# Patient Record
Sex: Male | Born: 1945
Health system: Southern US, Community
[De-identification: ages and names within clinical notes are randomized; demographics above are authoritative.]

## PROBLEM LIST (undated history)

## (undated) DIAGNOSIS — Z973 Presence of spectacles and contact lenses: Secondary | ICD-10-CM

## (undated) DIAGNOSIS — F419 Anxiety disorder, unspecified: Secondary | ICD-10-CM

## (undated) DIAGNOSIS — Z8719 Personal history of other diseases of the digestive system: Secondary | ICD-10-CM

## (undated) DIAGNOSIS — C801 Malignant (primary) neoplasm, unspecified: Secondary | ICD-10-CM

## (undated) DIAGNOSIS — F32A Depression, unspecified: Secondary | ICD-10-CM

## (undated) DIAGNOSIS — K219 Gastro-esophageal reflux disease without esophagitis: Secondary | ICD-10-CM

## (undated) DIAGNOSIS — I10 Essential (primary) hypertension: Secondary | ICD-10-CM

---

## 1999-04-06 ENCOUNTER — Ambulatory Visit (HOSPITAL_COMMUNITY): Admission: RE | Admit: 1999-04-06 | Discharge: 1999-04-06 | Payer: Self-pay | Admitting: Internal Medicine

## 1999-04-06 ENCOUNTER — Encounter: Payer: Self-pay | Admitting: Internal Medicine

## 2001-07-23 ENCOUNTER — Encounter (INDEPENDENT_AMBULATORY_CARE_PROVIDER_SITE_OTHER): Payer: Self-pay

## 2001-07-23 ENCOUNTER — Other Ambulatory Visit: Admission: RE | Admit: 2001-07-23 | Discharge: 2001-07-23 | Payer: Self-pay | Admitting: Gastroenterology

## 2001-10-29 ENCOUNTER — Inpatient Hospital Stay (HOSPITAL_COMMUNITY): Admission: EM | Admit: 2001-10-29 | Discharge: 2001-10-30 | Payer: Self-pay | Admitting: Physical Therapy

## 2004-10-25 ENCOUNTER — Ambulatory Visit: Payer: Self-pay | Admitting: Gastroenterology

## 2004-11-05 ENCOUNTER — Ambulatory Visit: Payer: Self-pay | Admitting: Gastroenterology

## 2006-06-07 ENCOUNTER — Emergency Department (HOSPITAL_COMMUNITY): Admission: EM | Admit: 2006-06-07 | Discharge: 2006-06-07 | Payer: Self-pay | Admitting: Emergency Medicine

## 2006-06-19 ENCOUNTER — Inpatient Hospital Stay (HOSPITAL_COMMUNITY): Admission: RE | Admit: 2006-06-19 | Discharge: 2006-06-20 | Payer: Self-pay | Admitting: Neurosurgery

## 2006-07-27 ENCOUNTER — Encounter: Admission: RE | Admit: 2006-07-27 | Discharge: 2006-07-27 | Payer: Self-pay | Admitting: Neurosurgery

## 2007-02-02 ENCOUNTER — Inpatient Hospital Stay (HOSPITAL_COMMUNITY): Admission: RE | Admit: 2007-02-02 | Discharge: 2007-02-04 | Payer: Self-pay | Admitting: Neurosurgery

## 2008-05-21 ENCOUNTER — Encounter: Admission: RE | Admit: 2008-05-21 | Discharge: 2008-05-21 | Payer: Self-pay | Admitting: Neurosurgery

## 2008-06-01 ENCOUNTER — Encounter: Admission: RE | Admit: 2008-06-01 | Discharge: 2008-06-01 | Payer: Self-pay | Admitting: Neurosurgery

## 2009-10-26 ENCOUNTER — Encounter (INDEPENDENT_AMBULATORY_CARE_PROVIDER_SITE_OTHER): Payer: Self-pay

## 2009-10-27 ENCOUNTER — Ambulatory Visit: Payer: Self-pay | Admitting: Gastroenterology

## 2009-11-10 ENCOUNTER — Ambulatory Visit: Payer: Self-pay | Admitting: Gastroenterology

## 2009-11-10 DIAGNOSIS — K2990 Gastroduodenitis, unspecified, without bleeding: Secondary | ICD-10-CM

## 2009-11-10 DIAGNOSIS — K2970 Gastritis, unspecified, without bleeding: Secondary | ICD-10-CM | POA: Insufficient documentation

## 2009-11-13 LAB — CONVERTED CEMR LAB: UREASE: NEGATIVE

## 2009-11-15 ENCOUNTER — Encounter: Payer: Self-pay | Admitting: Gastroenterology

## 2010-01-24 ENCOUNTER — Ambulatory Visit: Payer: Self-pay | Admitting: Vascular Surgery

## 2010-10-23 NOTE — Miscellaneous (Signed)
Summary: [facility] PV prep  Clinical Lists Changes  Medications: Added new medication of MOVIPREP 100 GM  SOLR (PEG-KCL-NACL-NASULF-NA ASC-C) as directed - Signed Rx of MOVIPREP 100 GM  SOLR (PEG-KCL-NACL-NASULF-NA ASC-C) as directed;  #1 x 0;  Signed;  Entered by: Doristine Church RN II;  Authorized by: Mardella Layman MD [facility];  Method used: [facility]  [address] [phone]*, [address], [place], [place]  [address], Ph: [phone] or [phone], Fax: [phone] Allergies: Added new allergy or adverse reaction of * THIMARISOL Observations: Added new observation of ALLERGY REV: Done (10/27/2009 12:45) Added new observation of NKA: F (10/27/2009 12:45)    Prescriptions: MOVIPREP 100 GM  SOLR (PEG-KCL-NACL-NASULF-NA ASC-C) as directed  #1 x 0   Entered by:   Doristine Church RN II   Authorized by:   Mardella Layman MD [facility]   Signed by:   Doristine Church RN II on 10/27/2009   Method used:   [facility]        [facility]  [address] [phone]* (retail)       [address]       [place], [place]  [address]       Ph: [phone] or [phone]       Fax: [phone]   RxID:   [id number]

## 2010-10-23 NOTE — Letter (Signed)
Summary: Patient Notice- Polyp Results  [facility]  [address] [place], [place] [address]   Phone: [phone]  Fax: [phone]        November 15, 2009 MRN: [id number]    [name] [address] [place], [place]  [address]    Dear Mr. Fojtik,  I am pleased to inform you that the colon polyp(s) removed during your recent colonoscopy was (were) found to be benign (no cancer detected) upon pathologic examination.  I recommend you have a repeat colonoscopy examination in 5_ years to look for recurrent polyps, as having colon polyps increases your risk for having recurrent polyps or even colon cancer in the future.  Should you develop new or worsening symptoms of abdominal pain, bowel habit changes or bleeding from the rectum or bowels, please schedule an evaluation with either your primary care physician or with me.  Additional information/recommendations:  __ No further action with gastroenterology is needed at this time. Please      follow-up with your primary care physician for your other healthcare      needs.  __ Please call [phone] to schedule a return visit to review your      situation.  __ Please keep your follow-up visit as already scheduled.  XX__ Continue treatment plan as outlined the day of your exam.  Please call us if you are having persistent problems or have questions about your condition that have not been fully answered at this time.  Sincerely,  Mardella Layman MD [facility]  This letter has been electronically signed by your physician.  Appended Document: Patient Notice- Polyp Results  Letter mailed 2.24.11

## 2010-10-23 NOTE — Procedures (Signed)
Summary: Upper Endoscopy  Patient: [name] Note: All result statuses are Final unless otherwise noted.  Tests: (1) Upper Endoscopy (EGD)   EGD Upper Endoscopy       DONE     [facility]     [address]     [place], [place]  [address]           ENDOSCOPY PROCEDURE REPORT           PATIENT:  [name], [name]  MR#:  [id number]     BIRTHDATE:  1946/03/13, 63 yrs. old  GENDER:  male           ENDOSCOPIST:  Vania Rea. Jarold Motto, MD, [facility]     Referred by:           PROCEDURE DATE:  11/10/2009     PROCEDURE:  EGD with biopsy     ASA CLASS:  Class II     INDICATIONS:  Screening for Barrett's esopahgus in a GERD patient.                 MEDICATIONS:   There was residual sedation effect present from     prior procedure., Fentanyl 25 mcg IV, Versed 3 mg IV     TOPICAL ANESTHETIC:           DESCRIPTION OF PROCEDURE:   After the risks benefits and     alternatives of the procedure were thoroughly explained, informed     consent was obtained.  The LB GIF-H180 [id number] endoscope was     introduced through the mouth and advanced to the second portion of     the duodenum, limited by retching and gagging.   The instrument     was slowly withdrawn as the mucosa was fully examined.     <<PROCEDUREIMAGES>>           Barrett's esophagus was found in the distal esophagus. BIOPSIES     DONE.  Mild gastritis was found in the antrum. CLO BX. DONE.     There were multiple polyps identified. in the fundus. LARGE     INFLAMMED POLYPS BIOPSIED.IN FUNDUS,CARDIA,AND BODY OF STOMACH     Retroflexed views revealed not done.    The scope was then withdrawn     from the patient and the procedure completed.           COMPLICATIONS:  None           ENDOSCOPIC IMPRESSION:     1) Barrett's esophagus in the distal esophagus     2) Mild gastritis in the antrum     3) Polyps, multiple in the fundus     4) Not done     1.CHRONIC GERD.R/O BARRETT'S AND DYSPLASIA     2.MULTIPLE INFLAMMED GASTRIC  POLYPS.R/O ADENOMAS VS HYPERPLASTIC     POLYPS FROM PPI USE.     RECOMMENDATIONS:     1) await biopsy results     2) continue current medications           REPEAT EXAM:  No           ______________________________     Vania Rea. Jarold Motto, MD, [facility]           CC:  Pecola Lawless, MD           n.     Rosalie DoctorMarland Kitchen   Vania Rea. Kasim Mccorkle at 11/10/2009 02:49 PM           Katrina Stack, [id number]  Note: An exclamation mark (!) indicates a result that was not dispersed into the flowsheet. Document Creation Date: 11/10/2009 2:49 PM _______________________________________________________________________  (1) Order result status: Final Collection or observation date-time: 11/10/2009 14:39 Requested date-time:  Receipt date-time:  Reported date-time:  Referring Physician:   Ordering Physician: Sheryn Bison [id number]) Specimen Source:  Source: Launa Grill Order Number: [id number] Lab site:   Appended Document: Upper Endoscopy     Procedures Next Due Date:    EGD: 11/2011

## 2010-10-23 NOTE — Miscellaneous (Signed)
Summary: clotest  Clinical Lists Changes  Problems: Added new problem of GASTRITIS (ICD-535.50) Orders: Added new Test order of TLB-H Pylori Screen Gastric Biopsy (83013-CLOTEST) - Signed 

## 2010-10-23 NOTE — Procedures (Signed)
Summary: Colonoscopy  Patient: [name] Note: All result statuses are Final unless otherwise noted.  Tests: (1) Colonoscopy (COL)   COL Colonoscopy           DONE     [facility]     [address]     [place], [place]  [address]           COLONOSCOPY PROCEDURE REPORT           PATIENT:  [name], [name]  MR#:  [id number]     BIRTHDATE:  [birth year], 63 yrs. old  GENDER:  male           ENDOSCOPIST:  Vania Rea. Jarold Motto, MD, [facility]     Referred by:           PROCEDURE DATE:  11/10/2009     PROCEDURE:  Colonoscopy with biopsy     ASA CLASS:  Class II     INDICATIONS:  history of polyps           MEDICATIONS:   Fentanyl 50 mcg IV, Versed 7 mg IV           DESCRIPTION OF PROCEDURE:   After the risks benefits and     alternatives of the procedure were thoroughly explained, informed     consent was obtained.  Digital rectal exam was performed and     revealed no abnormalities.   The LB CF-H180AL [id number] endoscope     was introduced through the anus and advanced to the cecum, which     was identified by both the appendix and ileocecal valve, without     limitations.  The quality of the prep was adequate, using     MoviPrep.  The instrument was then slowly withdrawn as the colon     was fully examined.     <<PROCEDUREIMAGES>>           FINDINGS:  Moderate diverticulosis was found sigmoid to descending     A diminutive polyp was found in the rectum. COLD BX. EXCISED.     Retroflexed views in the rectum revealed no abnormalities.    The     scope was then withdrawn from the patient and the procedure     completed.           COMPLICATIONS:  None           ENDOSCOPIC IMPRESSION:     1) Moderate diverticulosis in the sigmoid to descending     2) Diminutive polyp in the rectum     R/O ADENOMA.     RECOMMENDATIONS:     1) High fiber diet.     2) Follow up colonoscopy in 5 years           REPEAT EXAM:  No           ______________________________     Vania Rea. Jarold Motto, MD,  [name]           CC:  Pecola Lawless, MD           n.     Rosalie DoctorMarland Kitchen   Vania Rea. Patterson at 11/10/2009 02:31 PM           Katrina Stack, [id number]  Note: An exclamation mark (!) indicates a result that was not dispersed into the flowsheet. Document Creation Date: 11/10/2009 2:31 PM _______________________________________________________________________  (1) Order result status: Final Collection or observation date-time: 11/10/2009 14:23 Requested date-time:  Receipt date-time:  Reported date-time:  Referring Physician:  Ordering Physician: Sheryn Bison [phone]) Specimen Source:  Source: Launa Grill Order Number: [id number] Lab site:   Appended Document: Orders Update    Clinical Lists Changes  Orders: Added new Test order of TLB-H Pylori Screen Gastric Biopsy (83013-CLOTEST) - Signed      Appended Document: Colonoscopy     Procedures Next Due Date:    Colonoscopy: 11/2014

## 2010-10-23 NOTE — Letter (Signed)
Summary: [facility] Instructions  [facility]  [address] [place], [place] [address]   Phone: [phone]  Fax: [phone]       [name]    [birth year]    MRN: [id number]        Procedure Day /Date:  Friday 11/10/2009     Arrival Time: 1:00 pm      Procedure Time: 2:00 pm     Location of Procedure:                    _x _  [facility] (4th Floor)   PREPARATION FOR COLONOSCOPY WITH MOVIPREP   Starting 5 days prior to your procedure Sunday 2/13 do not eat nuts, seeds, popcorn, corn, beans, peas,  salads, or any raw vegetables.  Do not take any fiber supplements (e.g. Metamucil, Citrucel, and Benefiber).  THE DAY BEFORE YOUR PROCEDURE         DATE: Thursday 2/17  1.  Drink clear liquids the entire day-NO SOLID FOOD  2.  Do not drink anything colored red or purple.  Avoid juices with pulp.  No orange juice.  3.  Drink at least 64 oz. (8 glasses) of fluid/clear liquids during the day to prevent dehydration and help the prep work efficiently.  CLEAR LIQUIDS INCLUDE: Water Jello Ice Popsicles Tea (sugar ok, no milk/cream) Powdered fruit flavored drinks Coffee (sugar ok, no milk/cream) Gatorade Juice: apple, white grape, white cranberry  Lemonade Clear bullion, consomm, broth Carbonated beverages (any kind) Strained chicken noodle soup Hard Candy                             4.  In the morning, mix first dose of MoviPrep solution:    Empty 1 Pouch A and 1 Pouch B into the disposable container    Add lukewarm drinking water to the top line of the container. Mix to dissolve    Refrigerate (mixed solution should be used within 24 hrs)  5.  Begin drinking the prep at 5:00 p.m. The MoviPrep container is divided by 4 marks.   Every 15 minutes drink the solution down to the next mark (approximately 8 oz) until the full liter is complete.   6.  Follow completed prep with 16 oz of clear liquid of your choice (Nothing red or purple).  Continue  to drink clear liquids until bedtime.  7.  Before going to bed, mix second dose of MoviPrep solution:    Empty 1 Pouch A and 1 Pouch B into the disposable container    Add lukewarm drinking water to the top line of the container. Mix to dissolve    Refrigerate  THE DAY OF YOUR PROCEDURE      DATE: Friday 2/18  Beginning at 9:00 am (5 hours before procedure):         1. Every 15 minutes, drink the solution down to the next mark (approx 8 oz) until the full liter is complete.  2. Follow completed prep with 16 oz. of clear liquid of your choice.    3. You may drink clear liquids until 12:00 pm  (2 HOURS BEFORE PROCEDURE).   MEDICATION INSTRUCTIONS  Unless otherwise instructed, you should take regular prescription medications with a small sip of water   as early as possible the morning of your procedure.    Additional medication instructions:  Do no t take HCTZ the morning of procedure  OTHER INSTRUCTIONS  You will need a responsible adult at least 64 years of age to accompany you and drive you home.   This person must remain in the waiting room during your procedure.  Wear loose fitting clothing that is easily removed.  Leave jewelry and other valuables at home.  However, you may wish to bring a book to read or  an iPod/MP3 player to listen to music as you wait for your procedure to start.  Remove all body piercing jewelry and leave at home.  Total time from sign-in until discharge is approximately 2-3 hours.  You should go home directly after your procedure and rest.  You can resume normal activities the  day after your procedure.  The day of your procedure you should not:   Drive   Make legal decisions   Operate machinery   Drink alcohol   Return to work  You will receive specific instructions about eating, activities and medications before you leave.    The above instructions have been reviewed and explained to me by  Doristine Church RN II   October 27, 2009 1:16 PM      I fully understand and can verbalize these instructions _____________________________ Date _________

## 2010-10-23 NOTE — Letter (Signed)
Summary: Patient Notice-[facility]  [address] [place], [place] [address]   Phone: [phone]  Fax: [phone]        November 15, 2009 MRN: [id number]    [name] [address] [place], [place]  [address]    Dear [name],  I am pleased to inform you that the biopsies taken during your recent endoscopic examination did not show any evidence of cancer upon pathologic examination.  However, your biopsies indicate you have a condition known as Barrett's esophagus. While not cancer, it is pre-cancerous (can progress to cancer) and needs to be monitored with repeat endoscopic examination and biopsies.  Fortunately, it is quite rare that this develops into cancer, but careful monitoring of the condition along with taking your medication as prescribed is important in reducing the risk of developing cancer.  It is my recommendation that you have a repeat upper gastrointestinal endoscopic examination in 2_ years.  Additional information/recommendations:  XX__Please call [phone] to schedule a return visit to further      evaluate your condition.EXAM FOR H.PYLORI WAS NEGATIVE....  XX__Continue with treatment plan as outlined the day of your exam.  Please call us if you have or develop heartburn, reflux symptoms, any swallowing problems, or if you have questions about your condition that have not been fully answered at this time.  Sincerely,  Mardella Layman MD [facility]  This letter has been electronically signed by your physician.  Appended Document: Patient Notice-Barrett's Esopghagus  Letter mailed 2.24.11

## 2011-02-05 NOTE — Assessment & Plan Note (Signed)
OFFICE VISIT   [name], [name]  DOB:  March 13, 1946                                       01/24/2010  ZOXWR#:[id number]   The patient is a 65 year old male with complaints of bilateral lower  extremity weakness and fatigue.  He does not really have pain in either  lower extremity.  He states that his symptoms become worse with walking.  His symptoms are completely relieved with sitting.  He occasionally has  some right foot paresthesias and temperature sensitivity since a  previous back operation.  He denies any symptoms of proprioception  problems in either lower extremity.  He has no open ulcers or rashes on  the feet.   CHRONIC MEDICAL PROBLEMS:  Include back pain, hypertension.  He also  smokes less than half a pack of cigarettes per day.  His last spine MRI  was approximately 18 months ago.   PAST SURGICAL HISTORY:  He has had a cervical and lumbar fusion.  He has  had an inguinal hernia repair and sinus surgery.   FAMILY HISTORY:  He had a paternal uncle who had an abdominal aortic  aneurysm.   SOCIAL HISTORY:  He has two children.  He smokes less than 1 packet  daily.  He consumed 4-6 alcoholic beverages per day.   REVIEW OF SYSTEMS:  Full 12 point review of systems was performed with  the patient today.  Please see intake referral form for details  regarding that.   PHYSICAL EXAM:  Vital signs:  Blood pressure is 157/90 in the left arm,  heart rate 76 and regular.  Weight 241.3 pounds.  HEENT:  Unremarkable.  Neck:  Has 2+ carotid pulses without bruit.  Chest:  Clear to  auscultation.  Cardiac:  Exam is regular rate and rhythm without murmur.  Abdomen:  Is obese, soft, nontender, nondistended.  No masses.  Extremities:  He has 2+ femoral pulses bilaterally.  He has 1+ dorsalis  pedis and 2+ posterior tibial pulses bilaterally.  Feet are pink and  cool but well-perfused with brisk capillary refill.  He does have some  hair loss from the calf down  to the foot bilaterally.  Neurological:  Exam shows symmetric lower extremity strength with extension and flexion  of the ankles and leg.  He has hyperesthetic skin on the right foot  compared to the left foot and compared to the right upper leg.  Musculoskeletal:  Exam shows no major joint deformities.   MEDICATIONS:  Include metoprolol, omeprazole, fluticasone, Diovan/HCT,  gabapentin.   He had bilateral ABIs performed today with exercise.  His ABIs at rest  were 1.09 on the right, 1.19 on the left with triphasic waveforms  bilaterally.  He had no significant decrease in his ABIs after 1 minute  of exercise.   I reviewed his lumbar MRI from 18 months ago.  This does show some  lumbar disk disease at L4-L5.   In summary, the patient most likely has symptoms of what I would call  pseudoclaudication with leg symptoms more related to his lumbar spine  and neurologic problems rather than a vascular problem.  He has normal  ABIs with normal pulse exam in both lower extremities.  I did counsel  him today on smoking cessation.  He is going to try to stop smoking and  has tried to quit in  the past several times and been unsuccessful.  I  did emphasize to him today the importance of smoking cessation to reduce  his overall atherosclerotic risk and preserve circulation to his lower  extremities as well as prevention of coronary disease.  In addition, I  counseled him today about a walking program of 30 minutes daily.  He  will try to do this and will rest as needed during this 30 minute  period.  I also discussed with him today some weight loss and gave him  some ideas on how to accomplish this.  He will try to lose 5 pounds over  the next 6 months.  He will also try to partially do this by decreasing  his alcohol consumption by half.  He will follow up with me on an as-  needed basis.  He is moving to [facility] in the near future.  A copy  of this note today was sent to him to take to his  new primary care  Annastasia Haskins or any neurologic surgeon that he follows up with at that  location.  Also of note, since he does have family history of abdominal  aortic aneurysm I advised him that when he turns age 76 he should  probably have a one time screening abdominal aortic ultrasound to make  sure he does not have an aneurysm.     Janetta Hora. Fields, MD  Electronically Signed   CEF/MEDQ  D:  01/24/2010  T:  01/24/2010  Job:  [id number]   cc:   Katrina Stack

## 2011-02-08 NOTE — Discharge Summary (Signed)
[place]  Patient:    [name], [name] Visit Number: [id number] MRN: [id number]          Service Type: MED Location: 2000 2020 01 Attending Physician:  Duke Salvia Dictated by:   Cornell Barman, P.A. Admit Date:  10/29/2001 Discharge Date: 10/30/2001   CC:         Titus Dubin. Alwyn Ren, M.D. [facility]   Discharge Summary  DISCHARGE DIAGNOSES: 1. Chest pain. 2. Myocardial infarction, ruled out. 3. Hypokalemia. 4. Hypercholesterolemia.  BRIEF ADMISSION HISTORY:  Mr. Purdy is a 65 year old married white male who developed heavy chest pressure on the morning of admission while working at his computer.  Duration was 30 minutes.  He also experienced tingling in both hands.  He did feel light-headed and had some blurred vision and mild confusion.  He denies shortness of breath, nausea or vomiting, or diaphoresis.  PAST MEDICAL HISTORY: 1. Questionable history of an intestinal obstruction. 2. Hypertension. 3. Gastroesophageal reflux disease with Barretts esophagitis. 4. Status post left hernia repair. 5. Status post sinus surgery. 6. Radial keratotomy.  HOSPITAL COURSE: #1 - CARDIOVASCULAR:  The patient presented with chest pain.  Cardiac enzymes were negative.  MI was ruled out.  The patient has been scheduled for a stress Cardiolite February 21, at 7:45 in the morning at the [place] office.  #2 - HYPOKALEMIA:  The patient admits to taking some of his wifes diuretic. We are currently replacing his potassium level.  At discharge, his potassium level was 3.3.  #3 - HYPERCHOLESTEROLEMIA:  The patient had a fasting lipid profile done that revealed total cholesterol of 217, otherwise lipid profile was within normal limits.  #4 - HYPERTENSION:  Blood pressure is well controlled.  MEDICATIONS AT DISCHARGE: 1. The patient is to resume his Prevacid 30 mg q.d. 2. Lotrel as at home.  DIET:  The patient is to follow a low fat  diet.  FOLLOWUP:  Follow up for a stress Cardiolite February 21, at 7:45 in the morning.  The patient is to follow up with Dr. Alwyn Ren after his stress test. Dictated by:   Cornell Barman, P.A. Attending Physician:  Duke Salvia DD:  10/30/01 TD:  10/31/01 Job: [id number]

## 2011-02-08 NOTE — Op Note (Signed)
NAME:  [name], [name] NO.:  [id number]   MEDICAL RECORD NO.:  [id number]          PATIENT TYPE:  INP   LOCATION:  2899                         FACILITY:  [facility]   PHYSICIAN:  Clydene Fake, M.D.  DATE OF BIRTH:  [birth year]   DATE OF PROCEDURE:  02/02/2007  DATE OF DISCHARGE:                               OPERATIVE REPORT   DIAGNOSIS:  Unstable spondylolisthesis with stenosis, lateral recessed  stenosis with radiculopathy at L4-L5.   POSTOPERATIVE DIAGNOSIS:  Unstable spondylolisthesis with stenosis,  lateral recessed stenosis with radiculopathy at L4-L5.   PROCEDURE:  __________  laminectomy.  Decompression at the L4 and the L5  root (2 levels).  Posterior lumbar interbody fusion at L4-5, Saber  interbody cage at L4-5, segmented pedicle Expedium pedicle screw  fixation L3 through L5, posterolateral fusion L2 to L5 (2 levels).  Autograft same incision and Infuse BMP and Helios (allograft).   SURGEON:  Clydene Fake, M.D.   ASSISTANT:  Dr. Jeral Fruit   ANESTHESIA:  General endotracheal tube anesthesia.   ESTIMATED BLOOD LOSS:  250 mL.   BLOOD:  None.   DRAINS:  None.   COMPLICATIONS:  None.   REASON FOR PROCEDURE:  The patient is a 65 year old gentleman with back  and leg pain worse on the right, found to have unstable  spondylolisthesis at 4-5 with bilateral pars defects and stenosis,  instability, and compression of L5 and especially on the right side.  The patient brought for decompression and fusion.   DESCRIPTION OF PROCEDURE:  The patient was brought to the operating  room, general anesthesia induced.  The patient was placed in the prone  position on the Wilson frame, all pressure points were padded.  The  patient was prepped and draped in a sterile fashion.  Site of incision  was injected with 20 mL of 1% lidocaine with epinephrine.  Incision was  then made in the midline in the lower lumbar spine incision, taken down  to fascia.  Hemostasis  was obtained with Bovie cauterization.  __________  fascia was incised and subperiosteal dissection was done  over L4-5 spinous process of the lamina __________  facet.  Loraine Leriche was  placed at the interspace and x-ray was done confirming our positioning  at L4-5.  We dissected a little more cephalad than caudally to expose  the transverse processes of L4 and L5.  Then repeated this process on  the right side with self-retaining retractors, then placed.  The L4  spinous process was very loose through the bilateral pars defects and L4  spinous process and lamina removed.  Deep __________  laminectomy was  done from the bottom of 3 through top of 5 decompressing with the L4 and  L5 roots complete resection of the pars and facetectomy at L4-5 was  done.  We were able decompress the nerve roots, both the 4 and 5 roots.  Foraminotomy was done over the __________  there were some spondylitic  processes from the L3-4 joint pushing towards the 4 root, so we  dissected all the way up to the pedicle of  4 and both __________  and  then followed the 4 root out on each side getting good posterior  decompression of this as __________  spine.  When we were finished we  noted that because of the dissection we needed to do to get the pressure  off the L3-4 joint, it was opened up on the medial inferior surface and  this was loose and unstable.  We then explored the disk space at L4-5  and incised the disk space __________  diskectomy and distracted the  interspace up to 11 mm with interspace distractors used the various  approaches to prepare the interspace for interbody fusion by removing  the disk material.  Went lateral with curets to push disk material down  and open the canal more by getting the pressure off 4 root from the  anterior surface by pushing any disk protrusion back down on the disk  space and removing it in this manner.  Extra decompression laterally  further decompressing of the 4 roots on  either side.  While we held  distraction on 1 side, the other side with packed bone.  All this bone  that was removed during the laminectomy was cleaned from its soft tissue  and chopped into small pieces and this bone was __________  packed in  the interspace.  The Infuse BMP was stripped, the strip was placed into  an 11 x 9 Saber cage and the cage packed with the autograft bone and the  cage was tapped into place.  We removed the distractor on the opposite  side and packed bone in the interspace and placed it in the second cage  packed with BMP and augmented autograft bone.  Explored the thecal sac  and nerve root and we still had good decompression, no bony fragments  were compressing into the roots.  Then we considered a dissection  cephalad so we could dissect off the tranverse process __________  the  L3 pedicle due to the instability at the L3-4 area, using fluoroscopy as  a guide, __________  point was decorticated with a high-speed drill.  Probe was placed down the pedicle.  We checked it with a small ball  probe making sure we had bony circumference, __________  tapped the  pedicle and checked it with the small ball probe again then placed an  Expedium pedicle screw, 50 mm was used to L3.  Prior to placing the  screw, we decorticated the transverse process of the lateral facet from  L3 through L5 bilaterally.  We continued to placing screws.  The next  one was at L4-L5 using the same technique, in it we placed 3 screws on  the contralateral side, 50 mm screws were used at L3, 45 mm screws were  used at L4 an L5.  We placed the Infuse BMP strips in the posterior  gutters __________ fusion L3 through L5.  We placed a rod in the screw  heads and then placed the locking nuts and tightened all 6 nuts with  __________  tightener.  __________  allograft bone was placed in the  posterior gutters for posterolateral fusion from L3 to L5.  Because we wanted a little more bony substance,  Helios allograft substitute was  used.  A strip of this was cut in half and packed in the posterolateral  gutters of L3 through L5 bilaterally.  When we were finished, we re-  explored the thecal sac and nerve roots.  We had good decompression.  We  placed some Gelfoam over the lateral gutters.  No bone fragments could  compress the nerve roots.  The retractors were removed.  The fascia was  closed with 0 Vicryl interrupted sutures.  Subcutaneous tissue closed  with 0, 2-0 Vicryl and 3-0 Vicryl interrupted sutures.  Skin closed with  Benzoin and Steri-Strips.  Note after all the screws were placed, we did  final AP and lateral fluoroscopic imaging showing good position of the  interbody cages at L4-5.  Good alignment of the spine, good position of  the screws at 3, 4 and 5.  The patient was awakened from anesthesia,  transferred to the recovery room in stable condition.           ______________________________  Clydene Fake, M.D.     JRH/MEDQ  D:  02/02/2007  T:  02/02/2007  Job:  [id number]

## 2011-02-08 NOTE — Op Note (Signed)
NAME:  [name], [name] NO.:  [id number]   MEDICAL RECORD NO.:  [id number]          PATIENT TYPE:  INP   LOCATION:  2550                         FACILITY:  [facility]   PHYSICIAN:  Clydene Fake, M.D.  DATE OF BIRTH:  1945-12-10   DATE OF PROCEDURE:  DATE OF DISCHARGE:                                 OPERATIVE REPORT   DIAGNOSIS:  Atrophy, spondylosis with cervical stenosis, core compression  myelopathy at C3-4.   POSTOPERATIVE DIAGNOSIS:  Atrophy, spondylosis with cervical stenosis, core  compression myelopathy at C3-4.   PROCEDURE:  Anterior cervical decompression, diskectomy, fusion of C3-4 with  live and autograft bone, Eagle anterior cervical plate.   SURGEON:  Clydene Fake, M.D.   ASSISTANT:  __________   ANESTHESIA:  General endotracheal tube anesthesia.   ESTIMATED BLOOD LOSS:  Minimal.   BLOOD GIVEN:  None.   DRAINS:  None.   COMPLICATIONS:  None.   REASON FOR PROCEDURE:  Patient is a 65 year old right-handed gentleman who  over the last month had pain in the neck radiating to the shoulder.  It is a  sharp and burning pain and it sometimes radiates down his arm.  He has  trouble walking and was found to be myelopathic on exam, with greatly  increased reflex and sustained clonus, gait disturbance.  An MRI was done  showing a large disk herniation and spondylitic changes at C3-4 with spinal  cord compression and cord change in the MRI.  Patient is brought in for  decompression and fusion.   PROCEDURE IN DETAIL:  The patient was brought into the operating room.  General anesthesia was induced.  Patient was placed in 10-pound halter  traction and prepped and draped in a sterile fashion.  The site of the  incision was injected with 10 cc 1% lidocaine with epinephrine.  An incision  was then made from the midline to the anterior border of the  sternocleidomastoid muscle on the left side.  The neck incision was taken  down to the platysma and  hemostasis was obtained with Bovie cauterization.  The platysma was incised with the Bovie and blunt dissection taken to the  anterior cervical fascia.  The anterior cervical spine needle was placed in  the interspace.  X-rays were obtained showing this was the 3-4 interspace.  Disk space was incised with 18 blade.  Partial diskectomy performed with  pituitary rongeurs and the needle was removed.  The longus colli muscle was  resected laterally on each side using the Bovie, and self-retaining  retractor was placed.  Disk space was then again incised with 15 blade and  diskectomy continued with pituitary rongeurs and curettes.  Anterior  osteophytes removed with Kerrison rongeurs.  Distraction pins were placed at  C3-4.  Interspace distracted and diskectomy continued.  The microscope was  brought in for microdissection.  The 1-mm and 2-mm Kerrison punches were  used to remove the posterior disk and ligament.  A high-speed drill was used  to remove osteophytes, and we continued with the decompression.  Posteriorly  there was a soft disk  herniation along with little calcified masses pushing  down and compressing the spinal cord.  We carefully dissected these out and  removed these with Kerrison punches.  When we were finished, we did central  decompression of the canal and bilateral nerve roots which decompressed the  foramen.  Got hemostasis with Gelfoam and thrombin.  We irrigated with  antibiotic solution and removed the Gelfoam.  We measured the disk space to  be 6 mm.  A 6-mm length neck allograft bone was tapped into place.  Distraction pins were removed.  Hemostasis was obtained with Gelfoam and  thrombin.  The weight was removed from the traction and the bone flap was in  place firmly.  An Eagle anterior cervical plate was placed over the anterior  cervical spine and two screws were place in the C3, two in the C4.  These  were tightened down.  Lateral x-ray was obtained showing good  position of  plate, screws, and bone plugs at the 3-4 level.  Retractor was removed.  Hemostasis was obtained with bipolar cauterization, Gelfoam and thrombin.  We irrigated with antibiotic solution.  We had good hemostasis.   The platysma was closed 3-0 Vicryl interrupted suture, subcutaneous tissue  close with same, skin closed with Benzoin and Steri-Strips.  Dressing was  placed.  The patient was placed in a soft cervical collar, woken from  anesthesia, and transferred to the recovery room in stable condition.           ______________________________  Clydene Fake, M.D.     JRH/MEDQ  D:  06/19/2006  T:  06/20/2006  Job:  [id number]

## 2011-12-16 ENCOUNTER — Encounter: Payer: Self-pay | Admitting: Gastroenterology

## 2012-07-08 ENCOUNTER — Encounter: Payer: Self-pay | Admitting: Gastroenterology

## 2014-11-14 ENCOUNTER — Encounter: Payer: Self-pay | Admitting: Gastroenterology

## 2016-01-23 ENCOUNTER — Encounter: Payer: Self-pay | Admitting: Gastroenterology

## 2019-11-07 ENCOUNTER — Ambulatory Visit: Payer: Medicare PPO | Attending: Internal Medicine

## 2019-11-07 DIAGNOSIS — Z23 Encounter for immunization: Secondary | ICD-10-CM | POA: Insufficient documentation

## 2019-11-07 NOTE — Progress Notes (Signed)
   Covid-19 Vaccination Clinic  Name:  [name]    MRN: [id number] DOB: 06/20/1946  11/07/2019  [name] was observed post Covid-19 immunization for 15 minutes without incidence. He was provided with Vaccine Information Sheet and instruction to access the V-Safe system.   [name] was instructed to call 911 with any severe reactions post vaccine: Marland Kitchen Difficulty breathing  . Swelling of your face and throat  . A fast heartbeat  . A bad rash all over your body  . Dizziness and weakness    Immunizations Administered    Name Date Dose VIS Date Route   Pfizer COVID-19 Vaccine 11/07/2019 10:51 AM 0.3 mL 09/03/2019 Intramuscular   Manufacturer: [place]   Lot: [id number]   [place]: [id number]

## 2019-11-30 ENCOUNTER — Ambulatory Visit: Payer: Medicare PPO | Attending: Internal Medicine

## 2019-11-30 DIAGNOSIS — Z23 Encounter for immunization: Secondary | ICD-10-CM | POA: Insufficient documentation

## 2019-11-30 NOTE — Progress Notes (Signed)
   Covid-19 Vaccination Clinic  Name:  MING YOUNGERS    MRN: [id number] DOB: 09/29/1945  11/30/2019  Mr. Droge was observed post Covid-19 immunization for 15 minutes without incident. He was provided with Vaccine Information Sheet and instruction to access the V-Safe system.   Mr. Deraad was instructed to call 911 with any severe reactions post vaccine: Marland Kitchen Difficulty breathing  . Swelling of face and throat  . A fast heartbeat  . A bad rash all over body  . Dizziness and weakness   Immunizations Administered    Name Date Dose VIS Date Route   Pfizer COVID-19 Vaccine 11/30/2019 12:15 PM 0.3 mL 09/03/2019 Intramuscular   Manufacturer: [place]   Lot: [id number]   [place]: [id number]

## 2020-03-30 DIAGNOSIS — R7301 Impaired fasting glucose: Secondary | ICD-10-CM | POA: Diagnosis not present

## 2020-03-30 DIAGNOSIS — I10 Essential (primary) hypertension: Secondary | ICD-10-CM | POA: Diagnosis not present

## 2020-03-30 DIAGNOSIS — F419 Anxiety disorder, unspecified: Secondary | ICD-10-CM | POA: Diagnosis not present

## 2020-11-20 DIAGNOSIS — R7301 Impaired fasting glucose: Secondary | ICD-10-CM | POA: Diagnosis not present

## 2020-11-20 DIAGNOSIS — I10 Essential (primary) hypertension: Secondary | ICD-10-CM | POA: Diagnosis not present

## 2020-11-20 DIAGNOSIS — Z125 Encounter for screening for malignant neoplasm of prostate: Secondary | ICD-10-CM | POA: Diagnosis not present

## 2020-11-27 DIAGNOSIS — F419 Anxiety disorder, unspecified: Secondary | ICD-10-CM | POA: Diagnosis not present

## 2020-11-27 DIAGNOSIS — F329 Major depressive disorder, single episode, unspecified: Secondary | ICD-10-CM | POA: Diagnosis not present

## 2020-11-27 DIAGNOSIS — M545 Low back pain, unspecified: Secondary | ICD-10-CM | POA: Diagnosis not present

## 2020-11-27 DIAGNOSIS — Z1212 Encounter for screening for malignant neoplasm of rectum: Secondary | ICD-10-CM | POA: Diagnosis not present

## 2020-11-27 DIAGNOSIS — K760 Fatty (change of) liver, not elsewhere classified: Secondary | ICD-10-CM | POA: Diagnosis not present

## 2020-11-27 DIAGNOSIS — R82998 Other abnormal findings in urine: Secondary | ICD-10-CM | POA: Diagnosis not present

## 2020-11-27 DIAGNOSIS — Z0000 Encounter for general adult medical examination without abnormal findings: Secondary | ICD-10-CM | POA: Diagnosis not present

## 2020-11-27 DIAGNOSIS — K219 Gastro-esophageal reflux disease without esophagitis: Secondary | ICD-10-CM | POA: Diagnosis not present

## 2020-11-27 DIAGNOSIS — G4700 Insomnia, unspecified: Secondary | ICD-10-CM | POA: Diagnosis not present

## 2020-11-27 DIAGNOSIS — I10 Essential (primary) hypertension: Secondary | ICD-10-CM | POA: Diagnosis not present

## 2021-02-15 DIAGNOSIS — R311 Benign essential microscopic hematuria: Secondary | ICD-10-CM | POA: Diagnosis not present

## 2021-02-16 ENCOUNTER — Other Ambulatory Visit (HOSPITAL_COMMUNITY): Payer: Self-pay | Admitting: Urology

## 2021-02-16 DIAGNOSIS — R311 Benign essential microscopic hematuria: Secondary | ICD-10-CM

## 2021-02-22 ENCOUNTER — Ambulatory Visit (HOSPITAL_BASED_OUTPATIENT_CLINIC_OR_DEPARTMENT_OTHER)
Admission: RE | Admit: 2021-02-22 | Discharge: 2021-02-22 | Disposition: A | Discharge status: Home / Self Care | Payer: Medicare PPO | Source: Ambulatory Visit | Attending: Urology | Admitting: Urology

## 2021-02-22 ENCOUNTER — Other Ambulatory Visit: Payer: Self-pay

## 2021-02-22 DIAGNOSIS — R311 Benign essential microscopic hematuria: Secondary | ICD-10-CM | POA: Diagnosis not present

## 2021-02-22 DIAGNOSIS — K7689 Other specified diseases of liver: Secondary | ICD-10-CM | POA: Diagnosis not present

## 2021-04-24 DIAGNOSIS — C672 Malignant neoplasm of lateral wall of bladder: Secondary | ICD-10-CM | POA: Diagnosis not present

## 2021-04-25 ENCOUNTER — Other Ambulatory Visit: Payer: Self-pay | Admitting: Urology

## 2021-04-27 DIAGNOSIS — K5730 Diverticulosis of large intestine without perforation or abscess without bleeding: Secondary | ICD-10-CM | POA: Diagnosis not present

## 2021-04-27 DIAGNOSIS — C672 Malignant neoplasm of lateral wall of bladder: Secondary | ICD-10-CM | POA: Diagnosis not present

## 2021-04-27 DIAGNOSIS — C679 Malignant neoplasm of bladder, unspecified: Secondary | ICD-10-CM | POA: Diagnosis not present

## 2021-04-27 DIAGNOSIS — N2889 Other specified disorders of kidney and ureter: Secondary | ICD-10-CM | POA: Diagnosis not present

## 2021-04-27 DIAGNOSIS — K862 Cyst of pancreas: Secondary | ICD-10-CM | POA: Diagnosis not present

## 2021-05-01 ENCOUNTER — Other Ambulatory Visit: Payer: Self-pay | Admitting: Urology

## 2021-05-01 DIAGNOSIS — K862 Cyst of pancreas: Secondary | ICD-10-CM

## 2021-05-04 ENCOUNTER — Encounter (HOSPITAL_BASED_OUTPATIENT_CLINIC_OR_DEPARTMENT_OTHER): Payer: Self-pay | Admitting: Urology

## 2021-05-04 ENCOUNTER — Other Ambulatory Visit: Payer: Self-pay

## 2021-05-04 NOTE — Progress Notes (Signed)
Spoke w/ via phone for pre-op interview---pt Lab needs dos----I stat, EKG               Lab results------ COVID test -----patient states asymptomatic no test needed Arrive at -------0630 05/09/2021 NPO after MN NO Solid Food.  Clear liquids from MN until---0530 05/09/2021 Med rec completed Medications to take morning of surgery -----take all usual AM except Losartan Hydrochlorothiazide  Diabetic medication -----n/a Patient instructed no nail polish to be worn day of surgery Patient instructed to bring photo id and insurance card day of surgery Patient aware to have Driver (ride ) / caregiver wife [name]   for 24 hours after surgery  Patient Special Instructions -----n/a Pre-Op special Istructions -----Covid testing 05/07/2021 Patient verbalized understanding of instructions that were given at this phone interview. Patient denies shortness of breath, chest pain, fever, cough at this phone interview.

## 2021-05-07 ENCOUNTER — Other Ambulatory Visit: Payer: Self-pay | Admitting: Urology

## 2021-05-08 ENCOUNTER — Ambulatory Visit
Admission: RE | Admit: 2021-05-08 | Discharge: 2021-05-08 | Disposition: A | Discharge status: Home / Self Care | Payer: Medicare PPO | Source: Ambulatory Visit | Attending: Urology | Admitting: Urology

## 2021-05-08 ENCOUNTER — Other Ambulatory Visit: Payer: Self-pay

## 2021-05-08 DIAGNOSIS — K862 Cyst of pancreas: Secondary | ICD-10-CM

## 2021-05-08 DIAGNOSIS — R935 Abnormal findings on diagnostic imaging of other abdominal regions, including retroperitoneum: Secondary | ICD-10-CM | POA: Diagnosis not present

## 2021-05-08 DIAGNOSIS — K769 Liver disease, unspecified: Secondary | ICD-10-CM | POA: Diagnosis not present

## 2021-05-08 LAB — SARS CORONAVIRUS 2 (TAT 6-24 HRS): SARS Coronavirus 2: NEGATIVE

## 2021-05-08 MED ORDER — GADOBENATE DIMEGLUMINE 529 MG/ML IV SOLN
20.0000 mL | Freq: Once | INTRAVENOUS | Status: AC | PRN
  Administered 2021-05-08: 20 mL via INTRAVENOUS

## 2021-05-08 NOTE — Anesthesia Preprocedure Evaluation (Addendum)
Anesthesia Evaluation  Patient identified by MRN, date of birth, ID band Patient awake    Reviewed: Allergy & Precautions, NPO status , Patient's Chart, lab work & pertinent test results  History of Anesthesia Complications Negative for: history of anesthetic complications  Airway Mallampati: II  TM Distance: >3 FB Neck ROM: Full    Dental  (+) Dental Advisory Given   Pulmonary former smoker,  05/07/2021 SARS coronavirus NEG   breath sounds clear to auscultation       Cardiovascular hypertension, Pt. on medications and Pt. on home beta blockers (-) angina Rhythm:Regular Rate:Normal     Neuro/Psych Anxiety Depression negative neurological ROS     GI/Hepatic Neg liver ROS, hiatal hernia, GERD  Medicated and Controlled,  Endo/Other  obese  Renal/GU negative Renal ROS   Bladder cancer    Musculoskeletal   Abdominal (+) + obese,   Peds  Hematology negative hematology ROS (+)   Anesthesia Other Findings   Reproductive/Obstetrics                            Anesthesia Physical Anesthesia Plan  ASA: 3  Anesthesia Plan: General   Post-op Pain Management:    Induction: Intravenous  PONV Risk Score and Plan: 2 and Dexamethasone  Airway Management Planned: LMA  Additional Equipment: None  Intra-op Plan:   Post-operative Plan:   Informed Consent: I have reviewed the patients History and Physical, chart, labs and discussed the procedure including the risks, benefits and alternatives for the proposed anesthesia with the patient or authorized representative who has indicated his/her understanding and acceptance.     Dental advisory given  Plan Discussed with: CRNA and Surgeon  Anesthesia Plan Comments:        Anesthesia Quick Evaluation

## 2021-05-09 ENCOUNTER — Encounter (HOSPITAL_BASED_OUTPATIENT_CLINIC_OR_DEPARTMENT_OTHER)
Admission: RE | Disposition: A | Discharge status: Home / Self Care | Payer: Self-pay | Source: Home / Self Care | Attending: Urology

## 2021-05-09 ENCOUNTER — Ambulatory Visit (HOSPITAL_BASED_OUTPATIENT_CLINIC_OR_DEPARTMENT_OTHER): Payer: Medicare PPO | Admitting: Anesthesiology

## 2021-05-09 ENCOUNTER — Encounter (HOSPITAL_BASED_OUTPATIENT_CLINIC_OR_DEPARTMENT_OTHER): Payer: Self-pay | Admitting: Urology

## 2021-05-09 ENCOUNTER — Ambulatory Visit (HOSPITAL_BASED_OUTPATIENT_CLINIC_OR_DEPARTMENT_OTHER)
Admission: RE | Admit: 2021-05-09 | Discharge: 2021-05-09 | Disposition: A | Discharge status: Home / Self Care | Payer: Medicare PPO | Attending: Urology | Admitting: Urology

## 2021-05-09 DIAGNOSIS — F418 Other specified anxiety disorders: Secondary | ICD-10-CM | POA: Diagnosis not present

## 2021-05-09 DIAGNOSIS — Z8249 Family history of ischemic heart disease and other diseases of the circulatory system: Secondary | ICD-10-CM | POA: Insufficient documentation

## 2021-05-09 DIAGNOSIS — Z87891 Personal history of nicotine dependence: Secondary | ICD-10-CM | POA: Diagnosis not present

## 2021-05-09 DIAGNOSIS — C679 Malignant neoplasm of bladder, unspecified: Secondary | ICD-10-CM | POA: Diagnosis not present

## 2021-05-09 DIAGNOSIS — I10 Essential (primary) hypertension: Secondary | ICD-10-CM | POA: Diagnosis not present

## 2021-05-09 DIAGNOSIS — C678 Malignant neoplasm of overlapping sites of bladder: Secondary | ICD-10-CM | POA: Diagnosis not present

## 2021-05-09 DIAGNOSIS — Z9852 Vasectomy status: Secondary | ICD-10-CM | POA: Diagnosis not present

## 2021-05-09 DIAGNOSIS — K2970 Gastritis, unspecified, without bleeding: Secondary | ICD-10-CM | POA: Diagnosis not present

## 2021-05-09 DIAGNOSIS — D090 Carcinoma in situ of bladder: Secondary | ICD-10-CM | POA: Diagnosis not present

## 2021-05-09 DIAGNOSIS — D494 Neoplasm of unspecified behavior of bladder: Secondary | ICD-10-CM | POA: Diagnosis not present

## 2021-05-09 DIAGNOSIS — Z9049 Acquired absence of other specified parts of digestive tract: Secondary | ICD-10-CM | POA: Diagnosis not present

## 2021-05-09 DIAGNOSIS — C672 Malignant neoplasm of lateral wall of bladder: Secondary | ICD-10-CM | POA: Insufficient documentation

## 2021-05-09 DIAGNOSIS — N3289 Other specified disorders of bladder: Secondary | ICD-10-CM | POA: Diagnosis not present

## 2021-05-09 HISTORY — DX: Malignant (primary) neoplasm, unspecified: C80.1

## 2021-05-09 HISTORY — DX: Depression, unspecified: F32.A

## 2021-05-09 HISTORY — DX: Gastro-esophageal reflux disease without esophagitis: K21.9

## 2021-05-09 HISTORY — DX: Presence of spectacles and contact lenses: Z97.3

## 2021-05-09 HISTORY — DX: Anxiety disorder, unspecified: F41.9

## 2021-05-09 HISTORY — DX: Essential (primary) hypertension: I10

## 2021-05-09 HISTORY — DX: Personal history of other diseases of the digestive system: Z87.19

## 2021-05-09 HISTORY — PX: TRANSURETHRAL RESECTION OF BLADDER TUMOR: SHX2575

## 2021-05-09 LAB — POCT I-STAT, CHEM 8
BUN: 8 mg/dL (ref 8–23)
Calcium, Ion: 1.19 mmol/L (ref 1.15–1.40)
Chloride: 92 mmol/L — ABNORMAL LOW (ref 98–111)
Creatinine, Ser: 0.8 mg/dL (ref 0.61–1.24)
Glucose, Bld: 118 mg/dL — ABNORMAL HIGH (ref 70–99)
HCT: 45 % (ref 39.0–52.0)
Hemoglobin: 15.3 g/dL (ref 13.0–17.0)
Potassium: 3.5 mmol/L (ref 3.5–5.1)
Sodium: 133 mmol/L — ABNORMAL LOW (ref 135–145)
TCO2: 29 mmol/L (ref 22–32)

## 2021-05-09 SURGERY — TURBT (TRANSURETHRAL RESECTION OF BLADDER TUMOR)
Anesthesia: General | Laterality: Right

## 2021-05-09 MED ORDER — TRAMADOL HCL 50 MG PO TABS: 50.0000 mg | ORAL_TABLET | Freq: Four times a day (QID) | ORAL | 0 refills | Status: AC | PRN

## 2021-05-09 MED ORDER — EPHEDRINE SULFATE-NACL 50-0.9 MG/10ML-% IV SOSY
PREFILLED_SYRINGE | INTRAVENOUS | Status: DC | PRN
  Administered 2021-05-09: 5 mg via INTRAVENOUS
  Administered 2021-05-09: 10 mg via INTRAVENOUS

## 2021-05-09 MED ORDER — ONDANSETRON HCL 4 MG/2ML IJ SOLN
INTRAMUSCULAR | Status: AC
  Filled 2021-05-09: qty 2

## 2021-05-09 MED ORDER — LIDOCAINE HCL (PF) 2 % IJ SOLN
INTRAMUSCULAR | Status: AC
  Filled 2021-05-09: qty 5

## 2021-05-09 MED ORDER — MIDAZOLAM HCL 2 MG/2ML IJ SOLN: 0.5000 mg | Freq: Once | INTRAMUSCULAR | Status: DC | PRN

## 2021-05-09 MED ORDER — OXYBUTYNIN CHLORIDE 5 MG PO TABS: 5.0000 mg | ORAL_TABLET | Freq: Three times a day (TID) | ORAL | 1 refills | Status: AC | PRN

## 2021-05-09 MED ORDER — PROPOFOL 10 MG/ML IV BOLUS
INTRAVENOUS | Status: AC
  Filled 2021-05-09: qty 20

## 2021-05-09 MED ORDER — MEPERIDINE HCL 25 MG/ML IJ SOLN: 6.2500 mg | INTRAMUSCULAR | Status: DC | PRN

## 2021-05-09 MED ORDER — ACETAMINOPHEN 500 MG PO TABS
1000.0000 mg | ORAL_TABLET | Freq: Once | ORAL | Status: AC
  Administered 2021-05-09: 1000 mg via ORAL

## 2021-05-09 MED ORDER — ACETAMINOPHEN 500 MG PO TABS
ORAL_TABLET | ORAL | Status: AC
  Filled 2021-05-09: qty 2

## 2021-05-09 MED ORDER — PROPOFOL 10 MG/ML IV BOLUS
INTRAVENOUS | Status: DC | PRN
  Administered 2021-05-09: 200 mg via INTRAVENOUS
  Administered 2021-05-09: 100 mg via INTRAVENOUS

## 2021-05-09 MED ORDER — BELLADONNA ALKALOIDS-OPIUM 16.2-60 MG RE SUPP: 1.0000 | Freq: Once | RECTAL | Status: DC

## 2021-05-09 MED ORDER — CEPHALEXIN 500 MG PO CAPS: 500.0000 mg | ORAL_CAPSULE | Freq: Two times a day (BID) | ORAL | 0 refills | Status: AC

## 2021-05-09 MED ORDER — ALBUTEROL SULFATE HFA 108 (90 BASE) MCG/ACT IN AERS
INHALATION_SPRAY | RESPIRATORY_TRACT | Status: DC | PRN
  Administered 2021-05-09: 6 via RESPIRATORY_TRACT

## 2021-05-09 MED ORDER — IOHEXOL 300 MG/ML  SOLN
INTRAMUSCULAR | Status: DC | PRN
  Administered 2021-05-09: 8 mL via URETHRAL

## 2021-05-09 MED ORDER — OXYCODONE HCL 5 MG PO TABS: 5.0000 mg | ORAL_TABLET | Freq: Once | ORAL | Status: DC | PRN

## 2021-05-09 MED ORDER — ARTIFICIAL TEARS OPHTHALMIC OINT
TOPICAL_OINTMENT | OPHTHALMIC | Status: AC
  Filled 2021-05-09: qty 3.5

## 2021-05-09 MED ORDER — PHENAZOPYRIDINE HCL 200 MG PO TABS: 200.0000 mg | ORAL_TABLET | Freq: Three times a day (TID) | ORAL | 0 refills | Status: AC | PRN

## 2021-05-09 MED ORDER — FENTANYL CITRATE (PF) 100 MCG/2ML IJ SOLN
INTRAMUSCULAR | Status: DC | PRN
  Administered 2021-05-09 (×3): 50 ug via INTRAVENOUS

## 2021-05-09 MED ORDER — DEXAMETHASONE SODIUM PHOSPHATE 10 MG/ML IJ SOLN
INTRAMUSCULAR | Status: DC | PRN
  Administered 2021-05-09: 10 mg via INTRAVENOUS

## 2021-05-09 MED ORDER — LIDOCAINE 2% (20 MG/ML) 5 ML SYRINGE
INTRAMUSCULAR | Status: DC | PRN
  Administered 2021-05-09: 40 mg via INTRAVENOUS

## 2021-05-09 MED ORDER — CEFAZOLIN SODIUM-DEXTROSE 2-4 GM/100ML-% IV SOLN
2.0000 g | Freq: Once | INTRAVENOUS | Status: AC
  Administered 2021-05-09: 2 g via INTRAVENOUS

## 2021-05-09 MED ORDER — SUGAMMADEX SODIUM 200 MG/2ML IV SOLN
INTRAVENOUS | Status: DC | PRN
  Administered 2021-05-09: 400 mg via INTRAVENOUS

## 2021-05-09 MED ORDER — MIDAZOLAM HCL 5 MG/5ML IJ SOLN
INTRAMUSCULAR | Status: DC | PRN
  Administered 2021-05-09: 2 mg via INTRAVENOUS

## 2021-05-09 MED ORDER — OXYCODONE HCL 5 MG/5ML PO SOLN
5.0000 mg | Freq: Once | ORAL | Status: DC | PRN
Start: 2021-05-09 — End: 2021-05-09

## 2021-05-09 MED ORDER — BELLADONNA ALKALOIDS-OPIUM 16.2-30 MG RE SUPP
RECTAL | Status: AC
  Filled 2021-05-09: qty 1

## 2021-05-09 MED ORDER — DEXAMETHASONE SODIUM PHOSPHATE 10 MG/ML IJ SOLN
INTRAMUSCULAR | Status: AC
  Filled 2021-05-09: qty 1

## 2021-05-09 MED ORDER — FENTANYL CITRATE (PF) 250 MCG/5ML IJ SOLN
INTRAMUSCULAR | Status: AC
  Filled 2021-05-09: qty 5

## 2021-05-09 MED ORDER — ROCURONIUM BROMIDE 10 MG/ML (PF) SYRINGE
PREFILLED_SYRINGE | INTRAVENOUS | Status: DC | PRN
  Administered 2021-05-09: 50 mg via INTRAVENOUS

## 2021-05-09 MED ORDER — GEMCITABINE CHEMO FOR BLADDER INSTILLATION 2000 MG
2000.0000 mg | Freq: Once | INTRAVENOUS | Status: AC
Start: 2021-05-09 — End: 2021-05-09
  Administered 2021-05-09: 2000 mg via INTRAVESICAL
  Filled 2021-05-09: qty 2000

## 2021-05-09 MED ORDER — PROMETHAZINE HCL 25 MG/ML IJ SOLN: 6.2500 mg | INTRAMUSCULAR | Status: DC | PRN

## 2021-05-09 MED ORDER — ALBUTEROL SULFATE HFA 108 (90 BASE) MCG/ACT IN AERS
INHALATION_SPRAY | RESPIRATORY_TRACT | Status: AC
  Filled 2021-05-09: qty 6.7

## 2021-05-09 MED ORDER — FENTANYL CITRATE (PF) 100 MCG/2ML IJ SOLN: 25.0000 ug | INTRAMUSCULAR | Status: DC | PRN

## 2021-05-09 MED ORDER — LACTATED RINGERS IV SOLN: INTRAVENOUS | Status: DC

## 2021-05-09 MED ORDER — BELLADONNA ALKALOIDS-OPIUM 16.2-30 MG RE SUPP
1.0000 | Freq: Once | RECTAL | Status: AC
  Administered 2021-05-09: 1 via RECTAL

## 2021-05-09 MED ORDER — SODIUM CHLORIDE 0.9 % IR SOLN
Status: DC | PRN
  Administered 2021-05-09: 3000 mL
  Administered 2021-05-09: 6000 mL

## 2021-05-09 MED ORDER — ONDANSETRON HCL 4 MG/2ML IJ SOLN
INTRAMUSCULAR | Status: DC | PRN
  Administered 2021-05-09: 4 mg via INTRAVENOUS

## 2021-05-09 MED ORDER — MIDAZOLAM HCL 2 MG/2ML IJ SOLN
INTRAMUSCULAR | Status: AC
  Filled 2021-05-09: qty 2

## 2021-05-09 SURGICAL SUPPLY — 27 items
BAG DRAIN URO-CYSTO SKYTR STRL (DRAIN) ×2 IMPLANT
BAG DRN RND TRDRP ANRFLXCHMBR (UROLOGICAL SUPPLIES) ×1
BAG DRN UROCATH (DRAIN) ×1
BAG URINE DRAIN 2000ML AR STRL (UROLOGICAL SUPPLIES) ×2 IMPLANT
CATH FOLEY 2WAY SLVR  5CC 18FR (CATHETERS) ×2
CATH FOLEY 2WAY SLVR 5CC 18FR (CATHETERS) ×1 IMPLANT
CATH URET 5FR 28IN OPEN ENDED (CATHETERS) ×2 IMPLANT
GLOVE SURG ENC MOIS LTX SZ6 (GLOVE) ×2 IMPLANT
GLOVE SURG ENC MOIS LTX SZ7.5 (GLOVE) ×2 IMPLANT
GLOVE SURG UNDER POLY LF SZ6.5 (GLOVE) ×2 IMPLANT
GLOVE SURG UNDER POLY LF SZ7 (GLOVE) ×2 IMPLANT
GOWN STRL REUS W/TWL LRG LVL3 (GOWN DISPOSABLE) ×4 IMPLANT
GOWN STRL REUS W/TWL XL LVL3 (GOWN DISPOSABLE) ×2 IMPLANT
GUIDEWIRE ZIPWRE .038 STRAIGHT (WIRE) ×2 IMPLANT
HOLDER FOLEY CATH W/STRAP (MISCELLANEOUS) ×2 IMPLANT
IV NS IRRIG 3000ML ARTHROMATIC (IV SOLUTION) ×6 IMPLANT
KIT TURNOVER CYSTO (KITS) ×2 IMPLANT
LOOP CUT BIPOLAR 24F LRG (ELECTROSURGICAL) ×2 IMPLANT
MANIFOLD NEPTUNE II (INSTRUMENTS) ×2 IMPLANT
PACK CYSTO (CUSTOM PROCEDURE TRAY) ×2 IMPLANT
STENT URET 6FRX24 CONTOUR (STENTS) ×2 IMPLANT
STENT URET 6FRX26 CONTOUR (STENTS) ×2 IMPLANT
SYR CONTROL 10ML LL (SYRINGE) ×2 IMPLANT
SYR TOOMEY IRRIG 70ML (MISCELLANEOUS) ×2
SYRINGE TOOMEY IRRIG 70ML (MISCELLANEOUS) ×1 IMPLANT
TUBE CONNECTING 12X1/4 (SUCTIONS) ×2 IMPLANT
WATER STERILE IRR 500ML POUR (IV SOLUTION) ×2 IMPLANT

## 2021-05-09 NOTE — Anesthesia Procedure Notes (Signed)
Procedure Name: LMA Insertion Date/Time: 05/09/2021 8:42 AM Performed by: Rogers Blocker, CRNA Pre-anesthesia Checklist: Patient identified, Emergency Drugs available, Suction available and Patient being monitored Patient Re-evaluated:Patient Re-evaluated prior to induction Oxygen Delivery Method: Circle System Utilized Preoxygenation: Pre-oxygenation with 100% oxygen Induction Type: IV induction Ventilation: Mask ventilation without difficulty LMA: LMA inserted LMA Size: 5.0 Number of attempts: 1 Placement Confirmation: positive ETCO2 Tube secured with: Tape Dental Injury: Teeth and Oropharynx as per pre-operative assessment

## 2021-05-09 NOTE — H&P (Signed)
PRE-OP H&P  Office Visit Report     04/24/2021   --------------------------------------------------------------------------------   [name]  MRN: [id number]  DOB: 1946-01-11, 75 year old Male  PRIMARY CARE:  Ermalene Searing. Philip Aspen, MD  REFERRING:  Glena Norfolk. Lovena Neighbours, MD  PROVIDER:  Ellison Hughs, M.D.  LOCATION:  [facility] [phone]     --------------------------------------------------------------------------------   CC/HPI: CC: Hematuria   HPI: [name] is a 75 year old male referred by Dr. Philip Aspen for a hematuria evaluation.   -Long history of microscopic hematuria. Negative CT and cysto in 2008.  -Smoking history: Previously smoked 1 ppd for ~10 years  -History of nephrolithiasis: denies  -History of CKD: denies  -Anti-coagulant/anti-platelet use: denies  -Hematologic disorders: denies  -History of UTI: denies  -History of GU surgery/trauma: denies  -Personal/family history of GU malignancies: denies   04/24/21: The patient presents today for a cystoscopy to complete his hematuria evaluation. Recent renal ultrasound showed no significant GU abnormalities. From a urinary standpoint, he reports a fluctuating force of stream, feels like he is adequately emptying his bladder. He has occasional episodes of urgency without incontinence. He denies interval episodes of gross hematuria, dysuria or UTIs.     ALLERGIES: None   MEDICATIONS: Escitalopram Oxalate  Flonase Allergy Relief  Gabapentin 100 mg capsule  Gas X  Hydrocortisone  Lorazepam 1 mg tablet  Losartan-Hydrochlorothiazide 100 mg-25 mg tablet  Melatonin 5 mg capsule  Metoprolol Tartrate 100 mg tablet Oral  Omeprazole 20 mg capsule,delayed release Oral  Potassium Gluconate     GU PSH: Vasectomy - 2008       Central Notes: Surgery Of Male Genitalia Vasectomy, Sinus Surgery, Back Surgery, Inguinal Hernia Repair   NON-GU PSH: Additional Spinal Fusion Cholecystectomy  (laparoscopic) Hernia Repair Sinus surgery     GU PMH: Microscopic hematuria - 02/15/2021 ED due to arterial insufficiency, Erectile dysfunction due to arterial insufficiency - 2014 Urinary Tract Inf, Unspec site, Pyuria - 2014      PMH Notes:  2007-06-19 08:42:05 - Note: Blood In Urine   NON-GU PMH: Anxiety, Anxiety - 2014 Decreased libido, Decreased libido - 2014 Personal history of other diseases of the circulatory system, History of hypertension - 2014 Personal history of other diseases of the digestive system, History of esophageal reflux - 2014 Personal history of other endocrine, nutritional and metabolic disease, History of hypercholesterolemia - 2014 Personal history of other mental and behavioral disorders, History of depression - 2014 Depression GERD Hypertension    FAMILY HISTORY: 1 Daughter - Daughter Family Health Status Number - Runs In Family Hypertension - Father   SOCIAL HISTORY: Marital Status: Married Preferred Language: English; Ethnicity: Not Hispanic Or Latino; Race: White Current Smoking Status: Patient does not smoke anymore.   Tobacco Use Assessment Completed: Used Tobacco in last 30 days? Has never drank.  Does not drink caffeine.     Notes: Tobacco Use, Caffeine Use, Alcohol Use, Marital History - Currently Married, Occupation:   REVIEW OF SYSTEMS:    GU Review Male:   Patient denies frequent urination, hard to postpone urination, burning/ pain with urination, get up at night to urinate, leakage of urine, stream starts and stops, trouble starting your stream, have to strain to urinate , erection problems, and penile pain.  Gastrointestinal (Upper):   Patient denies nausea, vomiting, and indigestion/ heartburn.  Gastrointestinal (Lower):   Patient denies diarrhea and constipation.  Constitutional:   Patient denies fever, night sweats, weight loss, and fatigue.  Skin:  Patient denies skin rash/ lesion and itching.  Eyes:   Patient denies blurred  vision and double vision.  Ears/ Nose/ Throat:   Patient denies sore throat and sinus problems.  Hematologic/Lymphatic:   Patient denies swollen glands and easy bruising.  Cardiovascular:   Patient denies leg swelling and chest pains.  Respiratory:   Patient denies cough and shortness of breath.  Endocrine:   Patient denies excessive thirst.  Musculoskeletal:   Patient denies back pain and joint pain.  Neurological:   Patient denies headaches and dizziness.  Psychologic:   Patient denies depression and anxiety.   VITAL SIGNS:      04/24/2021 10:42 AM  Weight 216 lb / 97.98 kg  Height 69 in / 175.26 cm  BP 99/60 mmHg  Pulse 60 /min  BMI 31.9 kg/m   GU PHYSICAL EXAMINATION:    Urethral Meatus: Normal size. No lesion, no wart, no discharge, no polyp. Normal location.  Penis: Circumcised, no warts, no cracks. No dorsal Peyronie's plaques, no left corporal Peyronie's plaques, no right corporal Peyronie's plaques, no scarring, no warts. No balanitis, no meatal stenosis.   MULTI-SYSTEM PHYSICAL EXAMINATION:    Constitutional: Well-nourished. No physical deformities. Normally developed. Good grooming.  Respiratory: No labored breathing, no use of accessory muscles.   Cardiovascular: Normal temperature, normal extremity pulses, no swelling, no varicosities.  Neurologic / Psychiatric: Oriented to time, oriented to place, oriented to person. No depression, no anxiety, no agitation.  Gastrointestinal: No mass, no tenderness, no rigidity, non obese abdomen.  Musculoskeletal: Normal gait and station of head and neck.     Complexity of Data:  Records Review:   Previous Patient Records  X-Ray Review: Renal Ultrasound: Reviewed Films. Reviewed Report. Discussed With Patient.    Notes:                     CLINICAL DATA: Benign microscopic hematuria.     EXAM:  RENAL / URINARY TRACT ULTRASOUND COMPLETE     COMPARISON: CT abdomen and pelvis 06/23/2007     FINDINGS:  Right Kidney:     Renal  measurements: 12.1 x 6.3 x 6.5 cm = volume: 259 mL.  Echogenicity within normal limits. No mass or hydronephrosis  visualized.     Left Kidney:     Renal measurements: 12.5 x 6.1 x 5.1 cm = volume: 202 mL.  Echogenicity within normal limits. No solid mass or hydronephrosis  visualized. 1.3 cm lower pole cyst.     Bladder:     Appears normal for degree of bladder distention.     Other:     Limited imaging of the liver demonstrates diffusely increased  parenchymal echogenicity as well as a 1.6 cm cyst. Enlarged  prostate.     IMPRESSION:  1. Unremarkable appearance of the kidneys aside from a small left  renal cyst.  2. Enlarged prostate.  3. Echogenic liver compatible with steatosis.        Electronically Signed  By: Logan Bores M.D.  On: 02/22/2021 14:09   PROCEDURES:         Flexible Cystoscopy - 52000  Risks, benefits, and some of the potential complications of the procedure were discussed at length with the patient including infection, bleeding, voiding discomfort, urinary retention, fever, chills, sepsis, and others. All questions were answered. Informed consent was obtained. Antibiotic prophylaxis was given. Sterile technique and intraurethral analgesia were used.  Meatus:  Normal size. Normal location. Normal condition.  Urethra:  No strictures.  External  Sphincter:  Normal.  Verumontanum:  Normal.  Prostate:  Non-obstructing. No hyperplasia.  Bladder Neck:  Non-obstructing.  Ureteral Orifices:  Normal location. Normal size. Normal shape. Effluxed clear urine.  Bladder:  A right lateral wall tumor. 2 cm tumor. No trabeculation. Normal mucosa. No stones.      The lower urinary tract was carefully examined. The procedure was well-tolerated and without complications. Antibiotic instructions were given. Instructions were given to call the office immediately for bloody urine, difficulty urinating, urinary retention, painful or frequent urination, fever, chills, nausea,  vomiting or other illness. The patient stated that he understood these instructions and would comply with them.         Urinalysis w/Scope Dipstick Dipstick Cont'd Micro  Color: Yellow Bilirubin: Neg mg/dL WBC/hpf: 0 - 5/hpf  Appearance: Clear Ketones: Neg mg/dL RBC/hpf: 0 - 2/hpf  Specific Gravity: 1.015 Blood: Neg ery/uL Bacteria: Few (10-25/hpf)  pH: 8.0 Protein: 1+ mg/dL Cystals: NS (Not Seen)  Glucose: Neg mg/dL Urobilinogen: 0.2 mg/dL Casts: NS (Not Seen)    Nitrites: Neg Trichomonas: Not Present    Leukocyte Esterase: Neg leu/uL Mucous: Not Present      Epithelial Cells: NS (Not Seen)      Yeast: NS (Not Seen)      Sperm: Not Present    ASSESSMENT:      ICD-10 Details  1 GU:   Bladder Cancer Lateral - C67.2 Undiagnosed New Problem   PLAN:           Orders Labs PSA, BUN/Creatinine  X-Rays: C.T. Hematuria With and Without I.V. Contrast          Schedule Return Visit/Planned Activity: ASAP - Schedule Surgery          Document Letter(s):  Created for Ermalene Searing. Philip Aspen, MD   Created for Patient: Clinical Summary         Notes:   -Cystoscopy today revealed a 2-3 cm papillary bladder tumor with features concerning for urothelial cell carcinoma involving the right lateral wall of the bladder and possibly the right ureteral orifice.  -CTU pending  -The risks, benefits and alternatives of cystoscopy with TURBT with gemcitabine instillation and possible right ureteral stent placement was discussed with the patient. The risks include, but are not limited to, bleeding, urinary tract infection, bladder perforation requiring prolonged catheterization and/or open bladder repair, ureteral obstruction, voiding dysfunction and the inherent risks of general anesthesia. The patient voices understanding and wishes to proceed.

## 2021-05-09 NOTE — Anesthesia Postprocedure Evaluation (Signed)
Anesthesia Post Note  Patient: [name]  Procedure(s) Performed: TRANSURETHRAL RESECTION OF BLADDER TUMOR (TURBT) WITH CYSTOSCOPY/  RIGHT URETERAL STENT PLACMENT/ INSTILLATION OF GEMCITABINE (Right)     Patient location during evaluation: PACU Anesthesia Type: General Level of consciousness: awake and alert, patient cooperative and oriented Pain management: pain level controlled Vital Signs Assessment: post-procedure vital signs reviewed and stable Respiratory status: spontaneous breathing, nonlabored ventilation and respiratory function stable Cardiovascular status: blood pressure returned to baseline and stable Postop Assessment: no apparent nausea or vomiting, adequate PO intake and able to ambulate Anesthetic complications: no   No notable events documented.  Last Vitals:  Vitals:   05/09/21 1100 05/09/21 1115  BP: 139/63 (!) 145/70  Pulse: 75 72  Resp: 14 (!) 7  Temp:    SpO2: 93% 95%    Last Pain:  Vitals:   05/09/21 1100  TempSrc:   PainSc: 0-No pain                 Costantino Kohlbeck,E. Jessia Kief

## 2021-05-09 NOTE — Transfer of Care (Signed)
Immediate Anesthesia Transfer of Care Note  Patient: [name]  Procedure(s) Performed: TRANSURETHRAL RESECTION OF BLADDER TUMOR (TURBT) WITH CYSTOSCOPY/  RIGHT URETERAL STENT PLACMENT/ INSTILLATION OF GEMCITABINE (Right)  Patient Location: PACU  Anesthesia Type:General  Level of Consciousness: awake, alert , oriented and patient cooperative  Airway & Oxygen Therapy: Patient Spontanous Breathing and Patient connected to nasal cannula oxygen  Post-op Assessment: Report given to RN and Post -op Vital signs reviewed and stable  Post vital signs: Reviewed and stable  Last Vitals:  Vitals Value Taken Time  BP 157/75 05/09/21 0954  Temp    Pulse 83 05/09/21 0956  Resp 17 05/09/21 0956  SpO2 93 % 05/09/21 0956  Vitals shown include unvalidated device data.  Last Pain:  Vitals:   05/09/21 0647  TempSrc: Oral  PainSc: 0-No pain      Patients Stated Pain Goal: 6 ([phone] [id number])  Complications: No notable events documented.

## 2021-05-09 NOTE — Discharge Instructions (Addendum)
  Post Anesthesia Home Care Instructions  **You may take tylenol(Acetaminophen) after 12:00 noon today** Activity: Get plenty of rest for the remainder of the day. A responsible adult should stay with you for 24 hours following the procedure.  For the next 24 hours, DO NOT: -Drive a car -Paediatric nurse -Drink alcoholic beverages -Take any medication unless instructed by your physician -Make any legal decisions or sign important papers.  Meals: Start with liquid foods such as gelatin or soup. Progress to regular foods as tolerated. Avoid greasy, spicy, heavy foods. If nausea and/or vomiting occur, drink only clear liquids until the nausea and/or vomiting subsides. Call your physician if vomiting continues.  Special Instructions/Symptoms: Your throat may feel dry or sore from the anesthesia or the breathing tube placed in your throat during surgery. If this causes discomfort, gargle with warm salt water. The discomfort should disappear within 24 hours.

## 2021-05-09 NOTE — Op Note (Signed)
Operative Note  Preoperative diagnosis:  1.  3 cm papillary bladder tumor involving the right bladder wall overlying the right ureteral sheath, but superior lateral to the right ureteral orifice and not directly involving it  Postoperative diagnosis: 1.  3 cm papillary bladder tumor involving the right bladder wall overlying the right ureteral sheath, but superior lateral to the right ureteral orifice and not directly involving it 2.  Patchy and erythematous lesion involving the right lateral bladder wall measuring approximately 1 cm  Procedure(s): 1.  Cystoscopy with TURBT (medium)  2.  Right ureteral stent placement 3.  Right retrograde pyelogram with intraoperative interpretation of fluoroscopic imaging 4.  Intravesical instillation of gemcitabine  Surgeon: Ellison Hughs, MD  Assistants:  None  Anesthesia:  General  Complications:  None  EBL: 10 mL  Specimens: 1.  Superficial and deep margins of right bladder tumor 2.  Right lateral bladder wall lesion  Drains/Catheters: 1.  Right 6 French, 26 cm JJ stent without tether  Intraoperative findings:   3 cm papillary bladder tumor involving the right bladder wall overlying the right ureteral sheath, but superior and lateral to the right ureteral orifice and not directly involving it. Patchy erythematous lesion involving the right lateral bladder wall measuring approximately 1 cm Solitary right collecting system with no filling defects or dilation involving the right ureter or right renal pelvis seen on retrograde pyelogram   Indication:  [name] is a 75 y.o. male a papillary bladder tumor during office cystoscopy while being evaluated for episode of gross hematuria on 04/24/2021.  He has been consented for the above procedures, voices understanding and wishes to proceed.  Description of procedure:  After informed consent was obtained, the patient was brought to the operating room and general endotracheal anesthesia  with paralysis was administered. The patient was then placed in the dorsolithotomy position and prepped and draped in the usual sterile fashion. A timeout was performed. A 23 French rigid cystoscope was then inserted into the urethral meatus and advanced into the bladder under direct vision. A complete bladder survey revealed the findings listed above.  The rigid cystoscope was then exchanged for a 26 French resectoscope with a bipolar loop working element.  I resected the 3 cm bladder tumor overlying the right ureteral sheath first.  Started by resecting the superficial aspects of the tumor first and sending it off as a separate specimen.  I then resected down to the detrusor musculature and sent that specimen separately as well.  The area of resection was extensively fulgurated until hemostasis was achieved.    There was a 1 cm patchy erythematous area just lateral to the main bladder tumor that I resected and sent as a separate specimen as well.  That area was fulgurated until hemostasis was achieved.  There was no evidence of bladder tumor perforation involving either resection area.  Due to the larger bladder tumor directly overlying the right ureteral sheath, made the decision to place a ureteral stent.  A 5 French ureteral catheter was then inserted into the right ureteral orifice and a retrograde pyelogram was obtained, with the findings listed above.  A Glidewire was then used to intubate the lumen of the ureteral catheter and was advanced up to the right renal pelvis, under fluoroscopic guidance.  The catheter was then removed, leaving the wire in place.  A 6 French, 26 cm JJ stent was then advanced over the wire and into good position within the right collecting system, confirming placement via  fluoroscopy.  An 45 French Foley catheter was then inserted in the bladder and irrigated. The irrigant returned slightly pink with no clots. The patient was awakened and taken to the recovery room.  While  in the recovery room 2000 mg of gemcitabine in 50 mL of water was instilled in the bladder through the catheter and the catheter was plugged. This will remain indwelling for approximately one hour. It will then be drained from the bladder and the catheter will be removed and the patient discharged home.  Plan: Remove Foley catheter 1 hour after gemcitabine instillation.  Follow-up in 2 weeks for office cystoscopy and stent removal and to discuss pathology report.

## 2021-05-09 NOTE — Anesthesia Procedure Notes (Signed)
Procedure Name: Intubation Date/Time: 05/09/2021 9:04 AM Performed by: Rogers Blocker, CRNA Pre-anesthesia Checklist: Patient identified, Emergency Drugs available, Suction available and Patient being monitored Patient Re-evaluated:Patient Re-evaluated prior to induction Oxygen Delivery Method: Circle System Utilized Preoxygenation: Pre-oxygenation with 100% oxygen Induction Type: IV induction Ventilation: Mask ventilation without difficulty Laryngoscope Size: Mac and 4 Grade View: Grade I Tube type: Oral Tube size: 7.5 mm Number of attempts: 2 Airway Equipment and Method: Stylet and Oral airway Placement Confirmation: ETT inserted through vocal cords under direct vision, positive ETCO2 and breath sounds checked- equal and bilateral Secured at: 22 cm Tube secured with: Tape Dental Injury: Teeth and Oropharynx as per pre-operative assessment

## 2021-05-10 ENCOUNTER — Encounter (HOSPITAL_BASED_OUTPATIENT_CLINIC_OR_DEPARTMENT_OTHER): Payer: Self-pay | Admitting: Urology

## 2021-05-11 LAB — SURGICAL PATHOLOGY

## 2021-05-21 DIAGNOSIS — C672 Malignant neoplasm of lateral wall of bladder: Secondary | ICD-10-CM | POA: Diagnosis not present

## 2021-05-31 DIAGNOSIS — R7301 Impaired fasting glucose: Secondary | ICD-10-CM | POA: Diagnosis not present

## 2021-05-31 DIAGNOSIS — F329 Major depressive disorder, single episode, unspecified: Secondary | ICD-10-CM | POA: Diagnosis not present

## 2021-05-31 DIAGNOSIS — G4700 Insomnia, unspecified: Secondary | ICD-10-CM | POA: Diagnosis not present

## 2021-05-31 DIAGNOSIS — D494 Neoplasm of unspecified behavior of bladder: Secondary | ICD-10-CM | POA: Diagnosis not present

## 2021-05-31 DIAGNOSIS — Z23 Encounter for immunization: Secondary | ICD-10-CM | POA: Diagnosis not present

## 2021-05-31 DIAGNOSIS — I10 Essential (primary) hypertension: Secondary | ICD-10-CM | POA: Diagnosis not present

## 2021-06-21 DIAGNOSIS — Z5111 Encounter for antineoplastic chemotherapy: Secondary | ICD-10-CM | POA: Diagnosis not present

## 2021-06-21 DIAGNOSIS — C672 Malignant neoplasm of lateral wall of bladder: Secondary | ICD-10-CM | POA: Diagnosis not present

## 2021-06-25 DIAGNOSIS — D490 Neoplasm of unspecified behavior of digestive system: Secondary | ICD-10-CM | POA: Diagnosis not present

## 2021-06-26 DIAGNOSIS — D490 Neoplasm of unspecified behavior of digestive system: Secondary | ICD-10-CM | POA: Diagnosis not present

## 2021-06-28 DIAGNOSIS — C672 Malignant neoplasm of lateral wall of bladder: Secondary | ICD-10-CM | POA: Diagnosis not present

## 2021-06-28 DIAGNOSIS — Z5111 Encounter for antineoplastic chemotherapy: Secondary | ICD-10-CM | POA: Diagnosis not present

## 2021-06-29 ENCOUNTER — Other Ambulatory Visit: Payer: Self-pay | Admitting: Family Medicine

## 2021-06-29 ENCOUNTER — Other Ambulatory Visit: Payer: Self-pay | Admitting: Surgery

## 2021-06-29 DIAGNOSIS — D490 Neoplasm of unspecified behavior of digestive system: Secondary | ICD-10-CM

## 2021-07-05 DIAGNOSIS — C672 Malignant neoplasm of lateral wall of bladder: Secondary | ICD-10-CM | POA: Diagnosis not present

## 2021-07-05 DIAGNOSIS — Z5111 Encounter for antineoplastic chemotherapy: Secondary | ICD-10-CM | POA: Diagnosis not present

## 2021-07-12 DIAGNOSIS — C672 Malignant neoplasm of lateral wall of bladder: Secondary | ICD-10-CM | POA: Diagnosis not present

## 2021-07-12 DIAGNOSIS — Z5111 Encounter for antineoplastic chemotherapy: Secondary | ICD-10-CM | POA: Diagnosis not present

## 2021-07-19 DIAGNOSIS — Z5111 Encounter for antineoplastic chemotherapy: Secondary | ICD-10-CM | POA: Diagnosis not present

## 2021-07-19 DIAGNOSIS — C672 Malignant neoplasm of lateral wall of bladder: Secondary | ICD-10-CM | POA: Diagnosis not present

## 2021-07-20 ENCOUNTER — Ambulatory Visit
Admission: RE | Admit: 2021-07-20 | Discharge: 2021-07-20 | Disposition: A | Discharge status: Home / Self Care | Payer: Medicare PPO | Source: Ambulatory Visit | Attending: Surgery | Admitting: Surgery

## 2021-07-20 ENCOUNTER — Other Ambulatory Visit: Payer: Self-pay

## 2021-07-20 DIAGNOSIS — Z9049 Acquired absence of other specified parts of digestive tract: Secondary | ICD-10-CM | POA: Diagnosis not present

## 2021-07-20 DIAGNOSIS — D490 Neoplasm of unspecified behavior of digestive system: Secondary | ICD-10-CM

## 2021-07-20 DIAGNOSIS — K862 Cyst of pancreas: Secondary | ICD-10-CM | POA: Diagnosis not present

## 2021-07-20 DIAGNOSIS — R935 Abnormal findings on diagnostic imaging of other abdominal regions, including retroperitoneum: Secondary | ICD-10-CM | POA: Diagnosis not present

## 2021-07-20 DIAGNOSIS — D136 Benign neoplasm of pancreas: Secondary | ICD-10-CM | POA: Diagnosis not present

## 2021-07-20 MED ORDER — GADOBENATE DIMEGLUMINE 529 MG/ML IV SOLN
20.0000 mL | Freq: Once | INTRAVENOUS | Status: AC | PRN
  Administered 2021-07-20: 20 mL via INTRAVENOUS

## 2021-07-26 DIAGNOSIS — Z5111 Encounter for antineoplastic chemotherapy: Secondary | ICD-10-CM | POA: Diagnosis not present

## 2021-07-26 DIAGNOSIS — C672 Malignant neoplasm of lateral wall of bladder: Secondary | ICD-10-CM | POA: Diagnosis not present

## 2021-09-11 DIAGNOSIS — N3000 Acute cystitis without hematuria: Secondary | ICD-10-CM | POA: Diagnosis not present

## 2021-10-26 DIAGNOSIS — Z8551 Personal history of malignant neoplasm of bladder: Secondary | ICD-10-CM | POA: Diagnosis not present

## 2021-10-26 DIAGNOSIS — N401 Enlarged prostate with lower urinary tract symptoms: Secondary | ICD-10-CM | POA: Diagnosis not present

## 2021-10-26 DIAGNOSIS — R311 Benign essential microscopic hematuria: Secondary | ICD-10-CM | POA: Diagnosis not present

## 2021-10-26 DIAGNOSIS — R3912 Poor urinary stream: Secondary | ICD-10-CM | POA: Diagnosis not present

## 2021-10-26 DIAGNOSIS — N3000 Acute cystitis without hematuria: Secondary | ICD-10-CM | POA: Diagnosis not present

## 2021-11-30 DIAGNOSIS — R7301 Impaired fasting glucose: Secondary | ICD-10-CM | POA: Diagnosis not present

## 2021-11-30 DIAGNOSIS — I10 Essential (primary) hypertension: Secondary | ICD-10-CM | POA: Diagnosis not present

## 2021-11-30 DIAGNOSIS — Z125 Encounter for screening for malignant neoplasm of prostate: Secondary | ICD-10-CM | POA: Diagnosis not present

## 2021-12-07 DIAGNOSIS — G4700 Insomnia, unspecified: Secondary | ICD-10-CM | POA: Diagnosis not present

## 2021-12-07 DIAGNOSIS — R7301 Impaired fasting glucose: Secondary | ICD-10-CM | POA: Diagnosis not present

## 2021-12-07 DIAGNOSIS — R82998 Other abnormal findings in urine: Secondary | ICD-10-CM | POA: Diagnosis not present

## 2021-12-07 DIAGNOSIS — K219 Gastro-esophageal reflux disease without esophagitis: Secondary | ICD-10-CM | POA: Diagnosis not present

## 2021-12-07 DIAGNOSIS — R252 Cramp and spasm: Secondary | ICD-10-CM | POA: Diagnosis not present

## 2021-12-07 DIAGNOSIS — Z0000 Encounter for general adult medical examination without abnormal findings: Secondary | ICD-10-CM | POA: Diagnosis not present

## 2021-12-07 DIAGNOSIS — Z1331 Encounter for screening for depression: Secondary | ICD-10-CM | POA: Diagnosis not present

## 2021-12-07 DIAGNOSIS — Z8551 Personal history of malignant neoplasm of bladder: Secondary | ICD-10-CM | POA: Diagnosis not present

## 2021-12-07 DIAGNOSIS — K760 Fatty (change of) liver, not elsewhere classified: Secondary | ICD-10-CM | POA: Diagnosis not present

## 2021-12-07 DIAGNOSIS — Z1212 Encounter for screening for malignant neoplasm of rectum: Secondary | ICD-10-CM | POA: Diagnosis not present

## 2021-12-07 DIAGNOSIS — I10 Essential (primary) hypertension: Secondary | ICD-10-CM | POA: Diagnosis not present

## 2022-01-01 ENCOUNTER — Other Ambulatory Visit: Payer: Self-pay | Admitting: Surgery

## 2022-01-01 DIAGNOSIS — D490 Neoplasm of unspecified behavior of digestive system: Secondary | ICD-10-CM

## 2022-01-28 ENCOUNTER — Ambulatory Visit
Admission: RE | Admit: 2022-01-28 | Discharge: 2022-01-28 | Disposition: A | Discharge status: Home / Self Care | Payer: Medicare PPO | Source: Ambulatory Visit | Attending: Surgery | Admitting: Surgery

## 2022-01-28 DIAGNOSIS — K7689 Other specified diseases of liver: Secondary | ICD-10-CM | POA: Diagnosis not present

## 2022-01-28 DIAGNOSIS — K5730 Diverticulosis of large intestine without perforation or abscess without bleeding: Secondary | ICD-10-CM | POA: Diagnosis not present

## 2022-01-28 DIAGNOSIS — K862 Cyst of pancreas: Secondary | ICD-10-CM | POA: Diagnosis not present

## 2022-01-28 DIAGNOSIS — D490 Neoplasm of unspecified behavior of digestive system: Secondary | ICD-10-CM

## 2022-01-28 DIAGNOSIS — N281 Cyst of kidney, acquired: Secondary | ICD-10-CM | POA: Diagnosis not present

## 2022-01-28 MED ORDER — GADOBENATE DIMEGLUMINE 529 MG/ML IV SOLN
20.0000 mL | Freq: Once | INTRAVENOUS | Status: AC | PRN
  Administered 2022-01-28: 20 mL via INTRAVENOUS

## 2022-01-31 DIAGNOSIS — C672 Malignant neoplasm of lateral wall of bladder: Secondary | ICD-10-CM | POA: Diagnosis not present

## 2022-01-31 DIAGNOSIS — Z5111 Encounter for antineoplastic chemotherapy: Secondary | ICD-10-CM | POA: Diagnosis not present

## 2022-02-07 DIAGNOSIS — Z5111 Encounter for antineoplastic chemotherapy: Secondary | ICD-10-CM | POA: Diagnosis not present

## 2022-02-07 DIAGNOSIS — C672 Malignant neoplasm of lateral wall of bladder: Secondary | ICD-10-CM | POA: Diagnosis not present

## 2022-02-08 DIAGNOSIS — D490 Neoplasm of unspecified behavior of digestive system: Secondary | ICD-10-CM | POA: Diagnosis not present

## 2022-02-14 DIAGNOSIS — C672 Malignant neoplasm of lateral wall of bladder: Secondary | ICD-10-CM | POA: Diagnosis not present

## 2022-02-14 DIAGNOSIS — Z5111 Encounter for antineoplastic chemotherapy: Secondary | ICD-10-CM | POA: Diagnosis not present

## 2022-02-27 DIAGNOSIS — K219 Gastro-esophageal reflux disease without esophagitis: Secondary | ICD-10-CM | POA: Diagnosis not present

## 2022-02-27 DIAGNOSIS — K5900 Constipation, unspecified: Secondary | ICD-10-CM | POA: Diagnosis not present

## 2022-02-27 DIAGNOSIS — Z86010 Personal history of colonic polyps: Secondary | ICD-10-CM | POA: Diagnosis not present

## 2022-02-27 DIAGNOSIS — K2270 Barrett's esophagus without dysplasia: Secondary | ICD-10-CM | POA: Diagnosis not present

## 2022-02-27 DIAGNOSIS — R143 Flatulence: Secondary | ICD-10-CM | POA: Diagnosis not present

## 2022-03-15 DIAGNOSIS — Z09 Encounter for follow-up examination after completed treatment for conditions other than malignant neoplasm: Secondary | ICD-10-CM | POA: Diagnosis not present

## 2022-03-15 DIAGNOSIS — K2270 Barrett's esophagus without dysplasia: Secondary | ICD-10-CM | POA: Diagnosis not present

## 2022-03-15 DIAGNOSIS — Z86010 Personal history of colonic polyps: Secondary | ICD-10-CM | POA: Diagnosis not present

## 2022-03-15 DIAGNOSIS — K219 Gastro-esophageal reflux disease without esophagitis: Secondary | ICD-10-CM | POA: Diagnosis not present

## 2022-03-15 DIAGNOSIS — D120 Benign neoplasm of cecum: Secondary | ICD-10-CM | POA: Diagnosis not present

## 2022-03-15 DIAGNOSIS — K2930 Chronic superficial gastritis without bleeding: Secondary | ICD-10-CM | POA: Diagnosis not present

## 2022-03-15 DIAGNOSIS — K317 Polyp of stomach and duodenum: Secondary | ICD-10-CM | POA: Diagnosis not present

## 2022-03-15 DIAGNOSIS — D122 Benign neoplasm of ascending colon: Secondary | ICD-10-CM | POA: Diagnosis not present

## 2022-03-15 DIAGNOSIS — K5730 Diverticulosis of large intestine without perforation or abscess without bleeding: Secondary | ICD-10-CM | POA: Diagnosis not present

## 2022-03-15 DIAGNOSIS — K2100 Gastro-esophageal reflux disease with esophagitis, without bleeding: Secondary | ICD-10-CM | POA: Diagnosis not present

## 2022-03-15 DIAGNOSIS — K2970 Gastritis, unspecified, without bleeding: Secondary | ICD-10-CM | POA: Diagnosis not present

## 2022-03-19 DIAGNOSIS — K2100 Gastro-esophageal reflux disease with esophagitis, without bleeding: Secondary | ICD-10-CM | POA: Diagnosis not present

## 2022-03-19 DIAGNOSIS — K317 Polyp of stomach and duodenum: Secondary | ICD-10-CM | POA: Diagnosis not present

## 2022-03-19 DIAGNOSIS — D122 Benign neoplasm of ascending colon: Secondary | ICD-10-CM | POA: Diagnosis not present

## 2022-03-19 DIAGNOSIS — K2930 Chronic superficial gastritis without bleeding: Secondary | ICD-10-CM | POA: Diagnosis not present

## 2022-03-19 DIAGNOSIS — D120 Benign neoplasm of cecum: Secondary | ICD-10-CM | POA: Diagnosis not present

## 2022-03-20 DIAGNOSIS — L219 Seborrheic dermatitis, unspecified: Secondary | ICD-10-CM | POA: Diagnosis not present

## 2022-03-20 DIAGNOSIS — L729 Follicular cyst of the skin and subcutaneous tissue, unspecified: Secondary | ICD-10-CM | POA: Diagnosis not present

## 2022-03-20 DIAGNOSIS — L570 Actinic keratosis: Secondary | ICD-10-CM | POA: Diagnosis not present

## 2022-03-20 DIAGNOSIS — L820 Inflamed seborrheic keratosis: Secondary | ICD-10-CM | POA: Diagnosis not present

## 2022-04-05 DIAGNOSIS — Z8551 Personal history of malignant neoplasm of bladder: Secondary | ICD-10-CM | POA: Diagnosis not present

## 2022-05-30 DIAGNOSIS — Z5111 Encounter for antineoplastic chemotherapy: Secondary | ICD-10-CM | POA: Diagnosis not present

## 2022-05-30 DIAGNOSIS — C672 Malignant neoplasm of lateral wall of bladder: Secondary | ICD-10-CM | POA: Diagnosis not present

## 2022-06-06 DIAGNOSIS — C672 Malignant neoplasm of lateral wall of bladder: Secondary | ICD-10-CM | POA: Diagnosis not present

## 2022-06-06 DIAGNOSIS — Z5111 Encounter for antineoplastic chemotherapy: Secondary | ICD-10-CM | POA: Diagnosis not present

## 2022-06-13 DIAGNOSIS — C672 Malignant neoplasm of lateral wall of bladder: Secondary | ICD-10-CM | POA: Diagnosis not present

## 2022-06-13 DIAGNOSIS — Z5111 Encounter for antineoplastic chemotherapy: Secondary | ICD-10-CM | POA: Diagnosis not present

## 2022-06-17 DIAGNOSIS — H43812 Vitreous degeneration, left eye: Secondary | ICD-10-CM | POA: Diagnosis not present

## 2022-07-04 DIAGNOSIS — L089 Local infection of the skin and subcutaneous tissue, unspecified: Secondary | ICD-10-CM | POA: Diagnosis not present

## 2022-07-04 DIAGNOSIS — L723 Sebaceous cyst: Secondary | ICD-10-CM | POA: Diagnosis not present

## 2022-08-05 DIAGNOSIS — N3289 Other specified disorders of bladder: Secondary | ICD-10-CM | POA: Diagnosis not present

## 2022-08-05 DIAGNOSIS — C672 Malignant neoplasm of lateral wall of bladder: Secondary | ICD-10-CM | POA: Diagnosis not present

## 2022-08-08 DIAGNOSIS — Z8551 Personal history of malignant neoplasm of bladder: Secondary | ICD-10-CM | POA: Diagnosis not present

## 2022-08-08 DIAGNOSIS — R3912 Poor urinary stream: Secondary | ICD-10-CM | POA: Diagnosis not present

## 2022-09-26 DIAGNOSIS — C672 Malignant neoplasm of lateral wall of bladder: Secondary | ICD-10-CM | POA: Diagnosis not present

## 2022-09-26 DIAGNOSIS — Z5111 Encounter for antineoplastic chemotherapy: Secondary | ICD-10-CM | POA: Diagnosis not present

## 2022-10-03 DIAGNOSIS — Z5111 Encounter for antineoplastic chemotherapy: Secondary | ICD-10-CM | POA: Diagnosis not present

## 2022-10-03 DIAGNOSIS — C672 Malignant neoplasm of lateral wall of bladder: Secondary | ICD-10-CM | POA: Diagnosis not present

## 2022-10-10 DIAGNOSIS — Z5111 Encounter for antineoplastic chemotherapy: Secondary | ICD-10-CM | POA: Diagnosis not present

## 2022-10-10 DIAGNOSIS — C672 Malignant neoplasm of lateral wall of bladder: Secondary | ICD-10-CM | POA: Diagnosis not present

## 2022-12-12 DIAGNOSIS — R3915 Urgency of urination: Secondary | ICD-10-CM | POA: Diagnosis not present

## 2022-12-12 DIAGNOSIS — N401 Enlarged prostate with lower urinary tract symptoms: Secondary | ICD-10-CM | POA: Diagnosis not present

## 2022-12-12 DIAGNOSIS — Z8551 Personal history of malignant neoplasm of bladder: Secondary | ICD-10-CM | POA: Diagnosis not present

## 2023-01-07 ENCOUNTER — Other Ambulatory Visit (HOSPITAL_COMMUNITY): Payer: Self-pay | Admitting: Surgery

## 2023-01-07 DIAGNOSIS — D490 Neoplasm of unspecified behavior of digestive system: Secondary | ICD-10-CM

## 2023-01-16 ENCOUNTER — Ambulatory Visit (HOSPITAL_COMMUNITY)
Admission: RE | Admit: 2023-01-16 | Discharge: 2023-01-16 | Disposition: A | Discharge status: Home / Self Care | Payer: Medicare PPO | Source: Ambulatory Visit | Attending: Surgery | Admitting: Surgery

## 2023-01-16 DIAGNOSIS — D490 Neoplasm of unspecified behavior of digestive system: Secondary | ICD-10-CM | POA: Insufficient documentation

## 2023-01-16 DIAGNOSIS — K862 Cyst of pancreas: Secondary | ICD-10-CM | POA: Diagnosis not present

## 2023-01-16 DIAGNOSIS — K7689 Other specified diseases of liver: Secondary | ICD-10-CM | POA: Diagnosis not present

## 2023-01-16 DIAGNOSIS — R935 Abnormal findings on diagnostic imaging of other abdominal regions, including retroperitoneum: Secondary | ICD-10-CM | POA: Diagnosis not present

## 2023-01-16 MED ORDER — GADOBUTROL 1 MMOL/ML IV SOLN
10.0000 mL | Freq: Once | INTRAVENOUS | Status: AC | PRN
  Administered 2023-01-16: 10 mL via INTRAVENOUS

## 2023-01-27 DIAGNOSIS — K219 Gastro-esophageal reflux disease without esophagitis: Secondary | ICD-10-CM | POA: Diagnosis not present

## 2023-01-27 DIAGNOSIS — I10 Essential (primary) hypertension: Secondary | ICD-10-CM | POA: Diagnosis not present

## 2023-01-27 DIAGNOSIS — F419 Anxiety disorder, unspecified: Secondary | ICD-10-CM | POA: Diagnosis not present

## 2023-01-27 DIAGNOSIS — Z125 Encounter for screening for malignant neoplasm of prostate: Secondary | ICD-10-CM | POA: Diagnosis not present

## 2023-01-27 DIAGNOSIS — R7989 Other specified abnormal findings of blood chemistry: Secondary | ICD-10-CM | POA: Diagnosis not present

## 2023-01-27 DIAGNOSIS — R7301 Impaired fasting glucose: Secondary | ICD-10-CM | POA: Diagnosis not present

## 2023-02-03 DIAGNOSIS — Z0000 Encounter for general adult medical examination without abnormal findings: Secondary | ICD-10-CM | POA: Diagnosis not present

## 2023-02-03 DIAGNOSIS — R82998 Other abnormal findings in urine: Secondary | ICD-10-CM | POA: Diagnosis not present

## 2023-02-03 DIAGNOSIS — F419 Anxiety disorder, unspecified: Secondary | ICD-10-CM | POA: Diagnosis not present

## 2023-02-03 DIAGNOSIS — H903 Sensorineural hearing loss, bilateral: Secondary | ICD-10-CM | POA: Diagnosis not present

## 2023-02-03 DIAGNOSIS — K219 Gastro-esophageal reflux disease without esophagitis: Secondary | ICD-10-CM | POA: Diagnosis not present

## 2023-02-03 DIAGNOSIS — R7301 Impaired fasting glucose: Secondary | ICD-10-CM | POA: Diagnosis not present

## 2023-02-03 DIAGNOSIS — I10 Essential (primary) hypertension: Secondary | ICD-10-CM | POA: Diagnosis not present

## 2023-02-03 DIAGNOSIS — Z8551 Personal history of malignant neoplasm of bladder: Secondary | ICD-10-CM | POA: Diagnosis not present

## 2023-02-11 DIAGNOSIS — K862 Cyst of pancreas: Secondary | ICD-10-CM | POA: Diagnosis not present

## 2023-02-11 NOTE — Progress Notes (Signed)
 "  PROVIDER:  LEONOR MACARIO DAWN, MD  MRN: [id number] DOB: [birth year] DATE OF ENCOUNTER: 02/11/2023 Subjective     Chief Complaint: RE-CHECK (follow MRI/)     History of Present Illness: Jospeh Hurley is a 77 y.o. adult who is seen today for follow up of pancreatic cysts. Since his visit last year, he has not had any new symptoms. He did fall a few months ago and says he bumped his epigastric area and had some pain for a few weeks, but this has resolved. He has no postprandial pain, nausea or vomiting. His pancreatic cysts appear stable on his recent follow up MRCP with no signs of pancreatitis.     Review of Systems: A complete review of systems was obtained from the patient.  I have reviewed this information and discussed as appropriate with the patient.  See HPI as well for other ROS.    Medical History: Past Medical History:  Diagnosis Date   Anxiety    Arthritis    Depression    GERD (gastroesophageal reflux disease)    History of cancer    Hyperlipidemia    Liver disease     Patient Active Problem List  Diagnosis   Pancreatic cyst (HHS-HCC)    Past Surgical History:  Procedure Laterality Date   CATARACT EXTRACTION     CHOLECYSTECTOMY     HERNIA REPAIR     SPINE SURGERY       Allergies  Allergen Reactions   Thimerosal Other (See Comments)    REACTION: it's a preservative    Current Outpatient Medications on File Prior to Visit  Medication Sig Dispense Refill   escitalopram oxalate (LEXAPRO) 20 MG tablet Take 20 mg by mouth once daily     LORazepam (ATIVAN) 1 MG tablet Take 1 mg by mouth at bedtime as needed     losartan-hydrochlorothiazide (HYZAAR) 100-25 mg tablet TAKE 1 TABLET BY MOUTH EVERY DAY FOR FOR BLOOD PRESSURE     metoprolol tartrate (LOPRESSOR) 50 MG tablet TAKE 2 TABLETS BY MOUTH EVERY MORNING AND 2 TABLETS EVERY EVENING     omeprazole (PRILOSEC) 40 MG DR capsule Take 40 mg by mouth 2 (two) times daily     tamsulosin  (FLOMAX) 0.4 mg capsule Take 0.4 mg by mouth once daily     No current facility-administered medications on file prior to visit.    Family History  Problem Relation Age of Onset   High blood pressure (Hypertension) Mother    High blood pressure (Hypertension) Father      Social History   Tobacco Use  Smoking Status Former   Current packs/day: 0.00   Types: Cigarettes   Start date: 70   Quit date: 2018   Years since quitting: 6.3  Smokeless Tobacco Never     Social History   Socioeconomic History   Marital status: Married  Tobacco Use   Smoking status: Former    Current packs/day: 0.00    Types: Cigarettes    Start date: 1973    Quit date: 2018    Years since quitting: 6.3   Smokeless tobacco: Never  Vaping Use   Vaping status: Never Used  Substance and Sexual Activity   Alcohol  use: Yes   Drug use: Defer   Sexual activity: Defer    Objective:    Vitals:   02/11/23 1054  BP: 136/79  Pulse: 72  Temp: 36.7 C (98 F)  SpO2: 96%  Weight: (!) 105.8 kg (233 lb 3.2 oz)  Height: 175.3 cm (5' 9)    Body mass index is 34.44 kg/m.  Physical Exam Vitals reviewed.  Constitutional:      General: He is not in acute distress.    Appearance: Normal appearance.  HENT:     Head: Normocephalic and atraumatic.  Pulmonary:     Effort: Pulmonary effort is normal. No respiratory distress.  Abdominal:     General: There is no distension.     Palpations: Abdomen is soft.     Tenderness: There is no abdominal tenderness.  Skin:    General: Skin is warm and dry.     Coloration: Skin is not jaundiced.  Neurological:     General: No focal deficit present.     Mental Status: He is alert and oriented to person, place, and time.        Labs, Imaging and Diagnostic Testing: MRCP 01/16/23: IMPRESSION: 1. Cluster of cystic lesions in the pancreatic head are similar to comparison MRI 01/28/2022. No worrisome features. No ductal dilatation. Favor  benign side branch IPMNs or post inflammatory cyst. Recommend follow-up MRI in 12 months. 2. Larger cystic lesion in the mid body the pancreas measuring 2.2 cm is also unchanged. No pancreatic duct dilatation. No suspicious enhancement or nodularity. Recommend follow-up as above. 3. Benign cysts in the RIGHT hepatic lobe. 4. Normal biliary tree.    Assessment and Plan:     Diagnoses and all orders for this visit:  Pancreatic cyst (HHS-HCC) -     MRI abdomen and MRCP w wo contrast w 3D; Future    77 yo male presenting for follow up of pancreatic cysts. His most recent MRI shows a stable 2.2cm cyst in the body of the pancreas, and a group of smaller cysts in the head. There are no mural nodules, no main PD dilation and no biliary ductal dilation. No indication for surgical resection at this time. These likely represent side-branch IPMN. We will continue surveillance and he will return for an MRI in 1 year. If cysts are stable at that time we can likely extend surveillance intervals.  Return in about 1 year (around 02/11/2024).   Matthew LYNN ALLEN, MD     "

## 2023-02-24 DIAGNOSIS — H903 Sensorineural hearing loss, bilateral: Secondary | ICD-10-CM | POA: Diagnosis not present

## 2023-04-14 DIAGNOSIS — Z5111 Encounter for antineoplastic chemotherapy: Secondary | ICD-10-CM | POA: Diagnosis not present

## 2023-04-14 DIAGNOSIS — C672 Malignant neoplasm of lateral wall of bladder: Secondary | ICD-10-CM | POA: Diagnosis not present

## 2023-04-21 DIAGNOSIS — C672 Malignant neoplasm of lateral wall of bladder: Secondary | ICD-10-CM | POA: Diagnosis not present

## 2023-04-21 DIAGNOSIS — Z5111 Encounter for antineoplastic chemotherapy: Secondary | ICD-10-CM | POA: Diagnosis not present

## 2023-04-28 DIAGNOSIS — C672 Malignant neoplasm of lateral wall of bladder: Secondary | ICD-10-CM | POA: Diagnosis not present

## 2023-04-28 DIAGNOSIS — Z5111 Encounter for antineoplastic chemotherapy: Secondary | ICD-10-CM | POA: Diagnosis not present

## 2023-05-22 IMAGING — MR MR ABDOMEN WO/W CM MRCP
18 of 20 series · 43 of 48 positions shown · IV contrast (MULTIHANCE)
Comparison: 05/08/2021

CLINICAL DATA: Follow-up IPMN

EXAM:
MRI ABDOMEN WITHOUT AND WITH CONTRAST (INCLUDING MRCP)
TECHNIQUE: Multiplanar multisequence MR imaging of the abdomen was performed
both before and after the administration of intravenous contrast.
Heavily T2-weighted images of the biliary and pancreatic ducts were
obtained, and three-dimensional MRCP images were rendered by post
processing.
CONTRAST:  20mL MULTIHANCE GADOBENATE DIMEGLUMINE 529 MG/ML IV SOLN

[Series 4: T2 · coronal · 5.0mm · 1.56mm/px · 1 of 42 slices shown (1 of 4)]
[im 1/42]
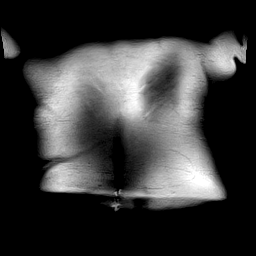

[Series 5: T1 · axial · 3.0mm · 1.25mm/px · z∈[-154,+83]mm · 5 of 160 slices shown]
[im 1/160]
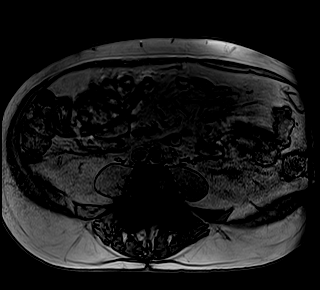
[im 40/160]
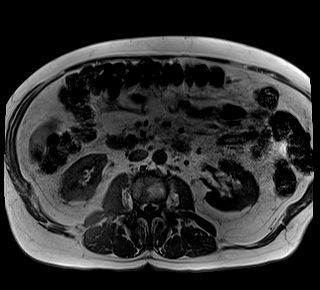
[im 80/160]
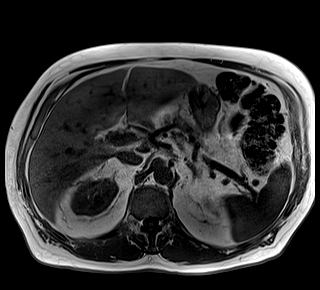
[im 120/160]
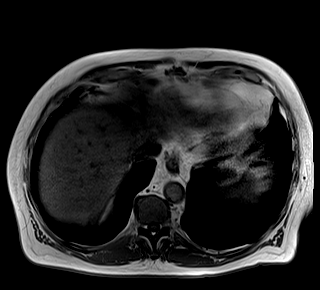
[im 160/160]
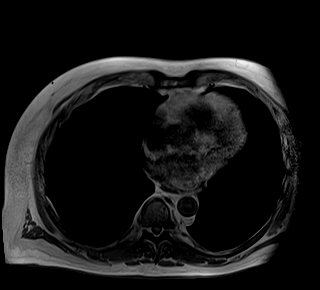

[Series 6: T2 · axial · 6.0mm · 1.25mm/px · 1 of 32 slices shown (2 of 4)]
[im 1/32]
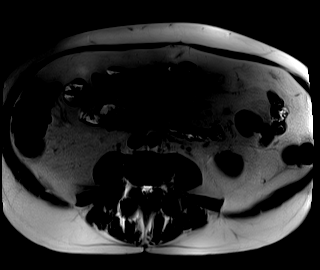

[Series 7: bSSFP · axial · 5.0mm · 1.31mm/px · 1 of 38 slices shown]
[im 1/38]
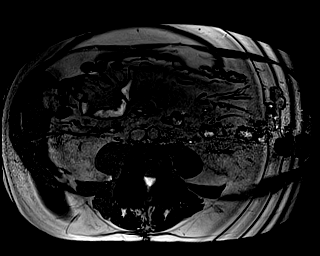

[Series 8: T2 · coronal · 3.0mm · 1.19mm/px · 1 of 21 slices shown (3 of 4)]
[im 1/21]
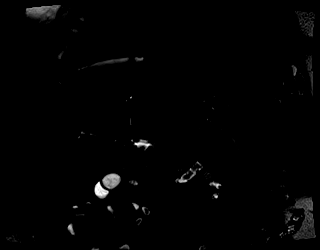

[Series 9: T2 · axial · 5.0mm · 1.56mm/px · 1 of 38 slices shown (4 of 4)]
[im 1/38]
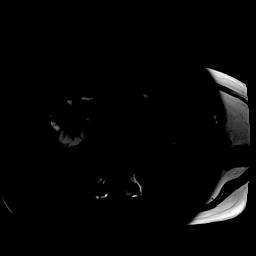

[Series 10: DWI · axial · 5.0mm · 1.42mm/px · z∈[-100,+110]mm · 4 of 108 slices shown (1 of 2)]
[im 1/108]
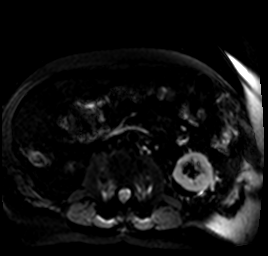
[im 36/108]
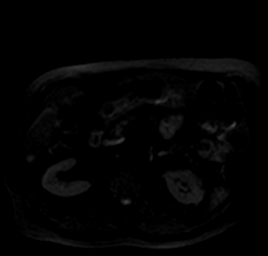
[im 72/108]
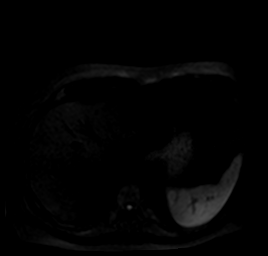
[im 108/108]
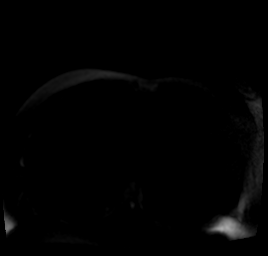

[Series 11: DWI · axial · 5.0mm · 1.42mm/px · 1 of 36 slices shown (2 of 2)]
[im 1/36]
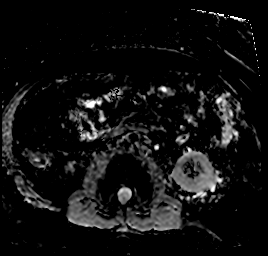

[Series 12: MRCP · coronal · 1.0mm · 0.49mm/px · 2 of 72 slices shown]
[im 1/72]
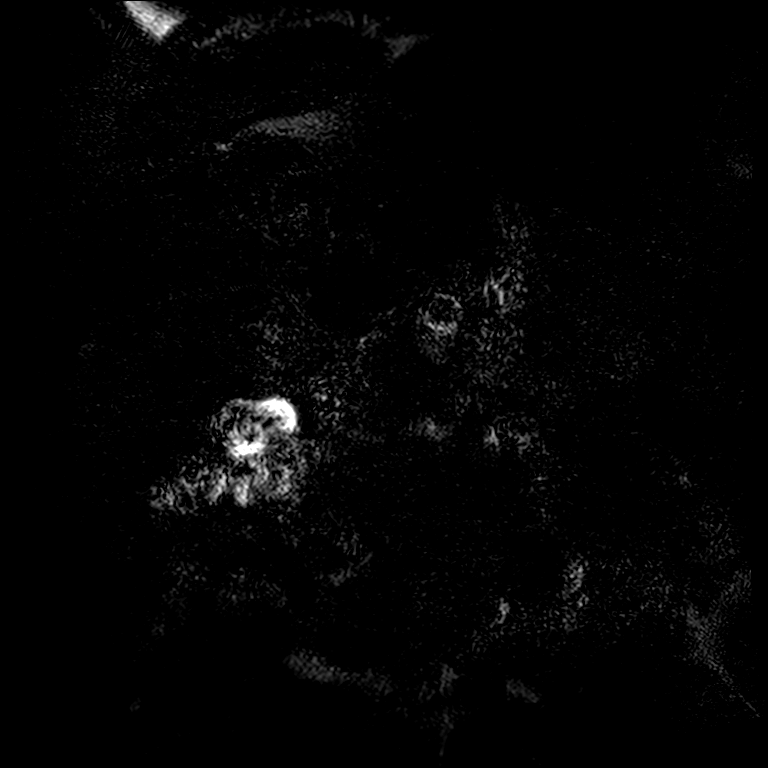
[im 72/72]
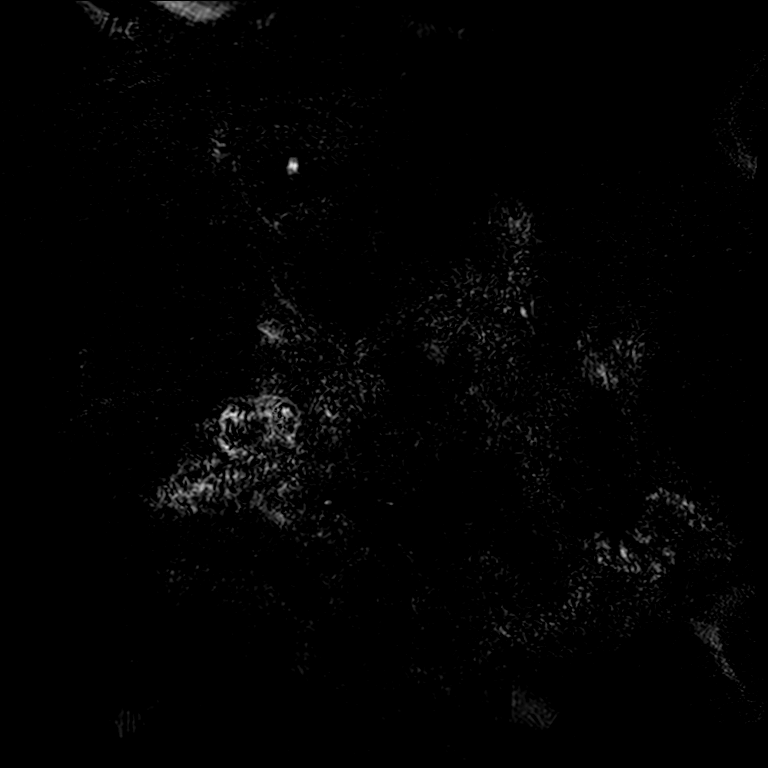

[Series 19: T1 dynamic · axial · non-contrast · 3.0mm · 1.25mm/px · z∈[-151,+110]mm · 3 of 88 slices shown]
[im 1/88]
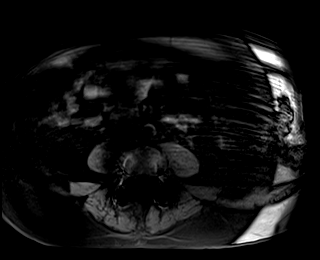
[im 44/88]
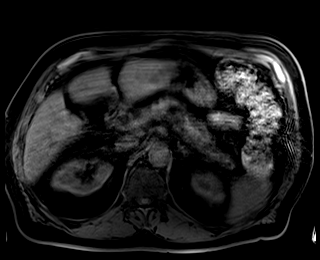
[im 88/88]
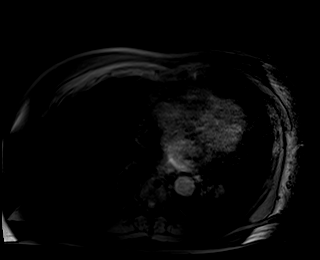

[Series 20: T1 dynamic post-contrast · axial · 3.0mm · 1.25mm/px · z∈[-151,+110]mm · 3 of 88 slices shown (1 of 8)]
[im 1/88]
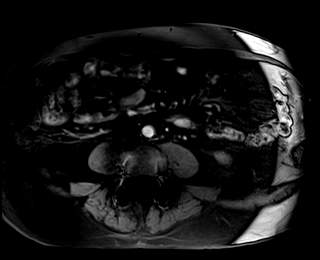
[im 44/88]
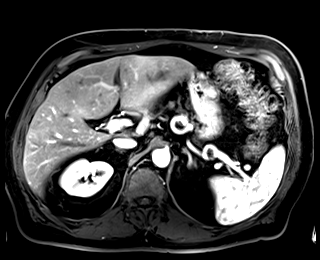
[im 88/88]
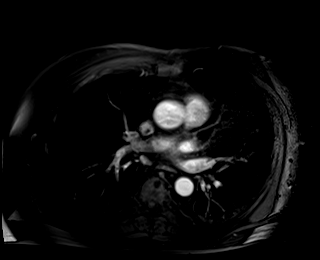

[Series 21: T1 dynamic post-contrast · axial · 3.0mm · 1.25mm/px · z∈[-151,+110]mm · 3 of 88 slices shown (2 of 8)]
[im 1/88]
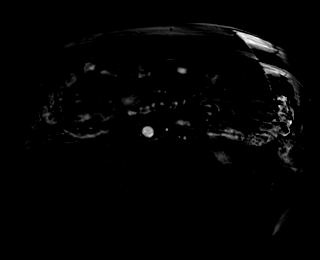
[im 44/88]
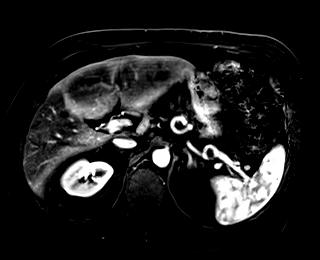
[im 88/88]
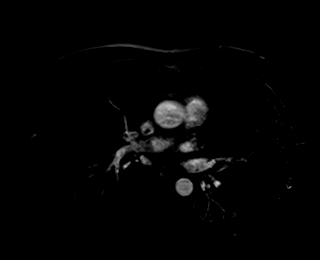

[Series 22: T1 dynamic post-contrast · axial · 3.0mm · 1.25mm/px · z∈[-151,+110]mm · 3 of 88 slices shown (3 of 8)]
[im 1/88]
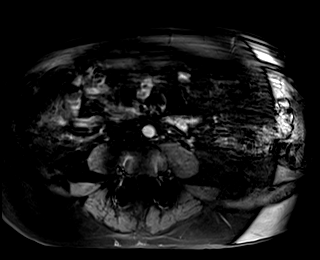
[im 44/88]
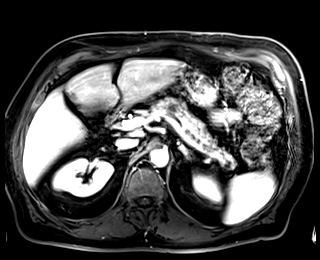
[im 88/88]
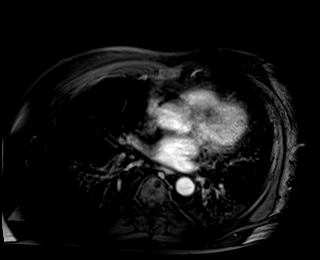

[Series 23: T1 dynamic post-contrast · axial · 3.0mm · 1.25mm/px · z∈[-151,+110]mm · 3 of 88 slices shown (4 of 8)]
[im 1/88]
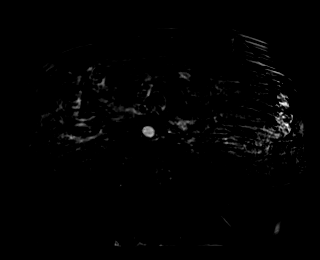
[im 44/88]
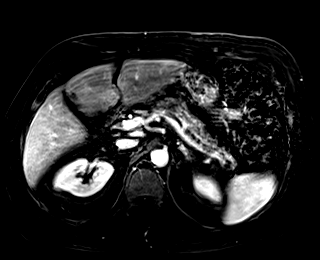
[im 88/88]
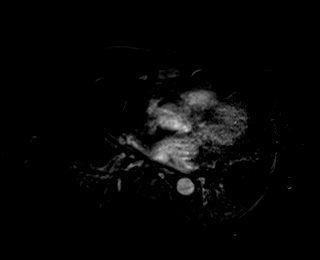

[Series 24: T1 dynamic post-contrast · axial · 3.0mm · 1.25mm/px · z∈[-151,+110]mm · 3 of 88 slices shown (5 of 8)]
[im 1/88]
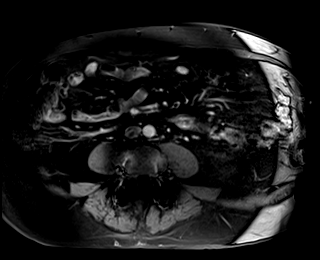
[im 44/88]
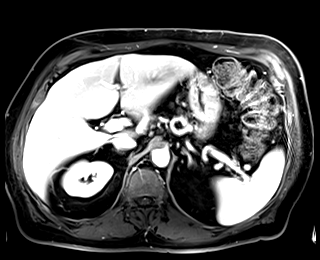
[im 88/88]
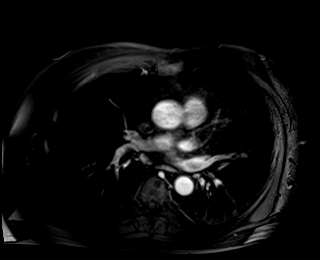

[Series 25: T1 dynamic post-contrast · axial · 3.0mm · 1.25mm/px · z∈[-151,+110]mm · 3 of 88 slices shown (6 of 8)]
[im 1/88]
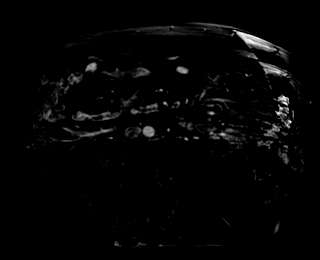
[im 44/88]
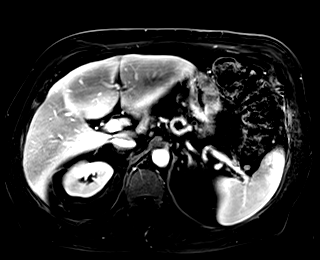
[im 88/88]
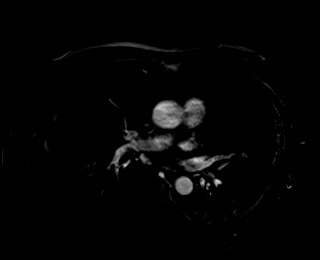

[Series 26: T1 dynamic post-contrast · coronal · 3.0mm · 1.25mm/px · 3 of 80 slices shown (7 of 8)]
[im 1/80]
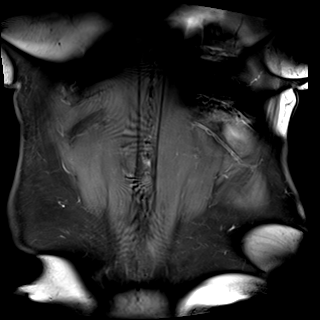
[im 40/80]
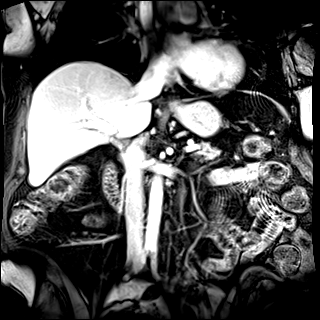
[im 80/80]
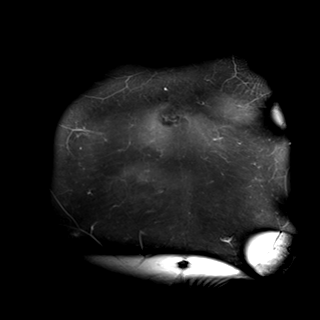

[Series 27: T1 dynamic post-contrast · axial · 3.0mm · 1.25mm/px · z∈[-151,-22]mm · 2 of 88 slices shown (8 of 8)]
[im 1/88]
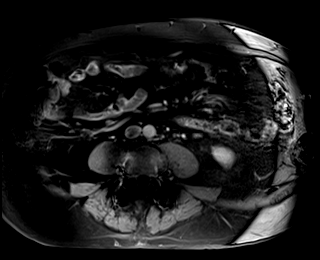
[im 44/88]
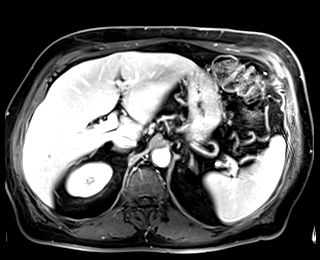

[43 of 48 positions shown; findings below may reference images not displayed]

FINDINGS: Lower chest: No acute findings.

Hepatobiliary: Occasional fluid signal cysts. No solid mass or other
parenchymal abnormality identified. Status post cholecystectomy. No
biliary ductal dilatation.

Pancreas: Multiple fluid signal cystic lesions are again seen
throughout the pancreas, unchanged, the largest in the pancreatic
body thinly septated and measuring 2.2 x 1.8 cm (series 6, image
20). No solid mass, inflammatory changes, or other parenchymal
abnormality identified. No pancreatic ductal dilatation.

Spleen:  Within normal limits in size and appearance.

Adrenals/Urinary Tract: No masses identified. No evidence of
hydronephrosis.

Stomach/Bowel: Visualized portions within the abdomen are
unremarkable.

Vascular/Lymphatic: No pathologically enlarged lymph nodes
identified. No abdominal aortic aneurysm demonstrated.

Other:  None.

Musculoskeletal: No suspicious bone lesions identified.
IMPRESSION: Stable appearance of multiple fluid signal cystic lesions throughout
the pancreas, the largest in the pancreatic body measuring 2.2 x
cm. These again are consistent with side-branch IPMNs or pancreatic
pseudocysts. Recommend follow up pre and post contrast MRI/MRCP or
pancreatic protocol CT in 1 year. This recommendation follows ACR
consensus guidelines: Management of Incidental Pancreatic Cysts: A
White Paper of the ACR Incidental Findings Committee. [HOSPITAL] 1859;[DATE].

## 2023-06-23 DIAGNOSIS — R3912 Poor urinary stream: Secondary | ICD-10-CM | POA: Diagnosis not present

## 2023-06-23 DIAGNOSIS — C672 Malignant neoplasm of lateral wall of bladder: Secondary | ICD-10-CM | POA: Diagnosis not present

## 2023-06-23 DIAGNOSIS — R8289 Other abnormal findings on cytological and histological examination of urine: Secondary | ICD-10-CM | POA: Diagnosis not present

## 2023-06-23 DIAGNOSIS — N401 Enlarged prostate with lower urinary tract symptoms: Secondary | ICD-10-CM | POA: Diagnosis not present

## 2023-10-01 DIAGNOSIS — H43812 Vitreous degeneration, left eye: Secondary | ICD-10-CM | POA: Diagnosis not present

## 2023-10-08 DIAGNOSIS — C672 Malignant neoplasm of lateral wall of bladder: Secondary | ICD-10-CM | POA: Diagnosis not present

## 2023-10-16 DIAGNOSIS — C672 Malignant neoplasm of lateral wall of bladder: Secondary | ICD-10-CM | POA: Diagnosis not present

## 2023-10-16 DIAGNOSIS — Z5111 Encounter for antineoplastic chemotherapy: Secondary | ICD-10-CM | POA: Diagnosis not present

## 2023-10-22 DIAGNOSIS — C672 Malignant neoplasm of lateral wall of bladder: Secondary | ICD-10-CM | POA: Diagnosis not present

## 2023-12-09 ENCOUNTER — Other Ambulatory Visit: Payer: Self-pay | Admitting: Surgery

## 2023-12-09 DIAGNOSIS — K862 Cyst of pancreas: Secondary | ICD-10-CM

## 2023-12-15 DIAGNOSIS — N401 Enlarged prostate with lower urinary tract symptoms: Secondary | ICD-10-CM | POA: Diagnosis not present

## 2023-12-15 DIAGNOSIS — R3912 Poor urinary stream: Secondary | ICD-10-CM | POA: Diagnosis not present

## 2023-12-15 DIAGNOSIS — C672 Malignant neoplasm of lateral wall of bladder: Secondary | ICD-10-CM | POA: Diagnosis not present

## 2024-01-11 ENCOUNTER — Ambulatory Visit
Admission: RE | Admit: 2024-01-11 | Discharge: 2024-01-11 | Disposition: A | Discharge status: Home / Self Care | Source: Ambulatory Visit | Attending: Surgery | Admitting: Surgery

## 2024-01-11 DIAGNOSIS — K862 Cyst of pancreas: Secondary | ICD-10-CM

## 2024-02-03 DIAGNOSIS — R7989 Other specified abnormal findings of blood chemistry: Secondary | ICD-10-CM | POA: Diagnosis not present

## 2024-02-03 DIAGNOSIS — I10 Essential (primary) hypertension: Secondary | ICD-10-CM | POA: Diagnosis not present

## 2024-02-03 DIAGNOSIS — R7301 Impaired fasting glucose: Secondary | ICD-10-CM | POA: Diagnosis not present

## 2024-02-03 DIAGNOSIS — Z125 Encounter for screening for malignant neoplasm of prostate: Secondary | ICD-10-CM | POA: Diagnosis not present

## 2024-02-10 DIAGNOSIS — Z8551 Personal history of malignant neoplasm of bladder: Secondary | ICD-10-CM | POA: Diagnosis not present

## 2024-02-10 DIAGNOSIS — R0609 Other forms of dyspnea: Secondary | ICD-10-CM | POA: Diagnosis not present

## 2024-02-10 DIAGNOSIS — R7301 Impaired fasting glucose: Secondary | ICD-10-CM | POA: Diagnosis not present

## 2024-02-10 DIAGNOSIS — I10 Essential (primary) hypertension: Secondary | ICD-10-CM | POA: Diagnosis not present

## 2024-02-10 DIAGNOSIS — H903 Sensorineural hearing loss, bilateral: Secondary | ICD-10-CM | POA: Diagnosis not present

## 2024-02-10 DIAGNOSIS — R82998 Other abnormal findings in urine: Secondary | ICD-10-CM | POA: Diagnosis not present

## 2024-02-10 DIAGNOSIS — Z0000 Encounter for general adult medical examination without abnormal findings: Secondary | ICD-10-CM | POA: Diagnosis not present

## 2024-02-10 DIAGNOSIS — K219 Gastro-esophageal reflux disease without esophagitis: Secondary | ICD-10-CM | POA: Diagnosis not present

## 2024-02-10 DIAGNOSIS — F419 Anxiety disorder, unspecified: Secondary | ICD-10-CM | POA: Diagnosis not present

## 2024-02-18 DIAGNOSIS — R0609 Other forms of dyspnea: Secondary | ICD-10-CM | POA: Diagnosis not present

## 2024-02-18 DIAGNOSIS — I10 Essential (primary) hypertension: Secondary | ICD-10-CM | POA: Diagnosis not present

## 2024-02-18 DIAGNOSIS — N401 Enlarged prostate with lower urinary tract symptoms: Secondary | ICD-10-CM | POA: Diagnosis not present

## 2024-02-18 DIAGNOSIS — J309 Allergic rhinitis, unspecified: Secondary | ICD-10-CM | POA: Diagnosis not present

## 2024-04-15 DIAGNOSIS — C672 Malignant neoplasm of lateral wall of bladder: Secondary | ICD-10-CM | POA: Diagnosis not present

## 2024-04-22 DIAGNOSIS — C672 Malignant neoplasm of lateral wall of bladder: Secondary | ICD-10-CM | POA: Diagnosis not present

## 2024-04-22 DIAGNOSIS — Z5111 Encounter for antineoplastic chemotherapy: Secondary | ICD-10-CM | POA: Diagnosis not present

## 2024-04-29 DIAGNOSIS — C672 Malignant neoplasm of lateral wall of bladder: Secondary | ICD-10-CM | POA: Diagnosis not present

## 2024-06-17 DIAGNOSIS — C672 Malignant neoplasm of lateral wall of bladder: Secondary | ICD-10-CM | POA: Diagnosis not present

## 2024-06-17 DIAGNOSIS — R3912 Poor urinary stream: Secondary | ICD-10-CM | POA: Diagnosis not present

## 2024-06-17 DIAGNOSIS — N401 Enlarged prostate with lower urinary tract symptoms: Secondary | ICD-10-CM | POA: Diagnosis not present

## 2024-06-17 DIAGNOSIS — Z8551 Personal history of malignant neoplasm of bladder: Secondary | ICD-10-CM | POA: Diagnosis not present

## 2024-06-17 DIAGNOSIS — N528 Other male erectile dysfunction: Secondary | ICD-10-CM | POA: Diagnosis not present

## 2024-10-28 ENCOUNTER — Other Ambulatory Visit (HOSPITAL_COMMUNITY): Payer: Self-pay | Admitting: Surgery

## 2024-10-28 ENCOUNTER — Encounter: Payer: Self-pay | Admitting: *Deleted

## 2024-10-28 DIAGNOSIS — K862 Cyst of pancreas: Secondary | ICD-10-CM
